# Patient Record
Sex: Male | Born: 1965
Health system: Southern US, Community
[De-identification: ages and names within clinical notes are randomized; demographics above are authoritative.]

## PROBLEM LIST (undated history)

## (undated) DIAGNOSIS — E119 Type 2 diabetes mellitus without complications: Secondary | ICD-10-CM

## (undated) DIAGNOSIS — I10 Essential (primary) hypertension: Secondary | ICD-10-CM

## (undated) DIAGNOSIS — J449 Chronic obstructive pulmonary disease, unspecified: Secondary | ICD-10-CM

---

## 2017-10-20 ENCOUNTER — Emergency Department (HOSPITAL_COMMUNITY)
Admission: EM | Admit: 2017-10-20 | Discharge: 2017-10-21 | Disposition: A | Discharge status: Home / Self Care | Payer: Self-pay | Attending: Emergency Medicine | Admitting: Emergency Medicine

## 2017-10-20 DIAGNOSIS — I10 Essential (primary) hypertension: Secondary | ICD-10-CM | POA: Insufficient documentation

## 2017-10-20 LAB — CBC WITH DIFFERENTIAL/PLATELET
Basophils Absolute: 0 10*3/uL (ref 0.0–0.1)
Basophils Relative: 0 %
EOS ABS: 0.2 10*3/uL (ref 0.0–0.7)
Eosinophils Relative: 5 %
HCT: 45.4 % (ref 39.0–52.0)
HEMOGLOBIN: 15.5 g/dL (ref 13.0–17.0)
LYMPHS ABS: 1.3 10*3/uL (ref 0.7–4.0)
LYMPHS PCT: 32 %
MCH: 28.6 pg (ref 26.0–34.0)
MCHC: 34.1 g/dL (ref 30.0–36.0)
MCV: 83.8 fL (ref 78.0–100.0)
Monocytes Absolute: 0.4 10*3/uL (ref 0.1–1.0)
Monocytes Relative: 9 %
NEUTROS PCT: 54 %
Neutro Abs: 2.2 10*3/uL (ref 1.7–7.7)
Platelets: 224 10*3/uL (ref 150–400)
RBC: 5.42 MIL/uL (ref 4.22–5.81)
RDW: 13.7 % (ref 11.5–15.5)
WBC: 4.1 10*3/uL (ref 4.0–10.5)

## 2017-10-20 NOTE — ED Triage Notes (Signed)
Patient here via EMS with complaints of hypertension. Reports that he is new to the area and does not have a primary care provider yet. Need refill on medication.

## 2017-10-20 NOTE — ED Notes (Addendum)
Pt stated that he had an incident of dizziness about 2 hours ago. He is asymptomatic at this time. He has no complaints. Denies N/V/CP or vision changes. Stated it has been months since he has had his HTN medication and his brother is on dialysis and when he became dizzy he "freaked out."

## 2017-10-20 NOTE — ED Provider Notes (Signed)
[facility]-EMERGENCY DEPT Provider Note   CSN: [id number] Arrival date & time: 10/20/17  2201     History   Chief Complaint Chief Complaint  Patient presents with  . Hypertension  . Medication Refill    HPI Donald Malone is a 52 y.o. male.  The history is provided by the patient and medical records. No language interpreter was used.     Donald Malone is a 52 y.o. male  with a PMH of HTN who presents to the Emergency Department complaining of elevated blood pressure and dizziness today. Patient reports that he just learned that his biological brother (patient is adopted and just introduced to biological family) is starting dialysis. He was reading about what could damage kidneys and read that uncontrolled hypertension could cause kidney damage.  He reports history of uncontrolled hypertension and has not been on his prescribed medication for the last 4-5 months.  He prior had been on enalapril 5 mg daily.  He states that when he learned this could cause such damage to the kidneys, leading to dialysis, that he "freaked out.  He started feeling weak and dizzy.  This lasted about 20 minutes and improved when he sat down.  He currently is asymptomatic.  He denies any current weakness, dizziness or lightheadedness.  He never had any chest pain or shortness of breath.  No abdominal pain or back pain.  He just moved to the area and has not found a primary care doctor.   No past medical history on file.  There are no active problems to display for this patient.       Home Medications    Prior to Admission medications   Medication Sig Start Date End Date Taking? Authorizing Provider  enalapril (VASOTEC) 5 MG tablet Take 1 tablet (5 mg total) by mouth daily. 10/21/17   Dominik Yordy, Chase Picket, PA-C    Family History No family history on file.  Social History Social History   Tobacco Use  . Smoking status: Not on file  Substance Use Topics  . Alcohol use: Not on  file  . Drug use: Not on file     Allergies   Patient has no allergy information on record.   Review of Systems Review of Systems  Respiratory: Negative for shortness of breath.   Cardiovascular: Negative for chest pain.  Neurological: Positive for dizziness and weakness.  All other systems reviewed and are negative.    Physical Exam Updated Vital Signs BP (!) 116/45 Comment: Pt sleeping  Pulse 63   Temp 97.6 F (36.4 C) (Oral)   Resp 16   SpO2 96%   Physical Exam  Constitutional: He is oriented to person, place, and time. He appears well-developed and well-nourished. No distress.  HENT:  Head: Normocephalic and atraumatic.  Cardiovascular: Normal rate, regular rhythm and normal heart sounds.  No murmur heard. Pulmonary/Chest: Effort normal and breath sounds normal. No respiratory distress.  Abdominal: Soft. He exhibits no distension. There is no tenderness.  Musculoskeletal: He exhibits no edema.  Neurological: He is alert and oriented to person, place, and time.  Alert, oriented, thought content appropriate, able to give a coherent history. Speech is clear and goal oriented, able to follow commands.  Cranial Nerves:  II:  Peripheral visual fields grossly normal, pupils equal, round, reactive to light III, IV, VI: EOM intact bilaterally, ptosis not present V,VII: smile symmetric, eyes kept closed tightly against resistance, facial light touch sensation equal VIII: hearing grossly normal IX, X:  symmetric soft palate movement, uvula elevates symmetrically  XI: bilateral shoulder shrug symmetric and strong XII: midline tongue extension 5/5 muscle strength in upper and lower extremities bilaterally including strong and equal grip strength and dorsiflexion/plantar flexion Sensory to light touch normal in all four extremities.  Normal finger-to-nose and rapid alternating movements; normal gait and balance. Negative romberg, no pronator drift.  Skin: Skin is warm and dry.    Nursing note and vitals reviewed.    ED Treatments / Results  Labs (all labs ordered are listed, but only abnormal results are displayed) Labs Reviewed  BASIC METABOLIC PANEL - Abnormal; Notable for the following components:      Result Value   Glucose, Bld 114 (*)    Creatinine, Ser 1.32 (*)    Calcium 8.7 (*)    All other components within normal limits  CBC WITH DIFFERENTIAL/PLATELET  I-STAT TROPONIN, ED    EKG EKG Interpretation  Date/Time:  Tuesday October 20 2017 23:30:22 EDT Ventricular Rate:  69 PR Interval:    QRS Duration: 86 QT Interval:  422 QTC Calculation: 453 R Axis:   52 Text Interpretation:  Sinus rhythm Borderline repolarization abnormality Confirmed by Geoffery Lyons ([id number]) on 10/20/2017 11:55:21 PM   Radiology No results found.  Procedures Procedures (including critical care time)  Medications Ordered in ED Medications  enalapril (VASOTEC) tablet 5 mg (5 mg Oral Given 10/21/17 0140)     Initial Impression / Assessment and Plan / ED Course  I have reviewed the triage vital signs and the nursing notes.  Pertinent labs & imaging results that were available during my care of the patient were reviewed by me and considered in my medical decision making (see chart for details).    Donald Malone is a 52 y.o. male who presents to ED for concerns of elevated blood pressure. He also states that he was reading that uncontrolled hypertension could cause kidney damage leading to dialysis and "freaked out". He had episode of lightheadedness and dizziness lasting about 20-30 minutes which resolved on its own. Currently asymptomatic. Never had chest pain, shortness of breath. No focal neuro deficits. Normal cardiopulmonary examination. Patient is currently asymptomatic with no sxs to suggest end organ damage. No chest pain, diaphoresis, nausea or other ACS symptoms. No headache or neurologic complaints. No change in urine output. Intact and equal distal pulses. Labs  reviewed and reassuring. Given home BP medication which he has not been taking for several months with improvement in BP.  Patient recently moved to the area and has not found primary care physician in town yet gave short course of his blood pressure medication and resources for PCPs. Evaluation does not show pathology that would require ongoing emergent intervention or inpatient treatment.Stressed the importance of taking blood pressure medication as directed.  Patient understands importance of close PCP follow up for further BP management. Reasons to return to ER were discussed and all questions answered.    Final Clinical Impressions(s) / ED Diagnoses   Final diagnoses:  Hypertension, unspecified type    ED Discharge Orders        Ordered    enalapril (VASOTEC) 5 MG tablet  Daily     10/21/17 0120       Wiley Magan, Chase Picket, PA-C 10/21/17 4034    Geoffery Lyons, MD 10/21/17 [phone]

## 2017-10-21 LAB — BASIC METABOLIC PANEL
ANION GAP: 9 (ref 5–15)
BUN: 15 mg/dL (ref 6–20)
CALCIUM: 8.7 mg/dL — AB (ref 8.9–10.3)
CO2: 27 mmol/L (ref 22–32)
Chloride: 105 mmol/L (ref 101–111)
Creatinine, Ser: 1.32 mg/dL — ABNORMAL HIGH (ref 0.61–1.24)
GFR calc Af Amer: >60 mL/min (ref >60)
GFR calc non Af Amer: >60 mL/min (ref >60)
GLUCOSE: 114 mg/dL — AB (ref 65–99)
Potassium: 4.6 mmol/L (ref 3.5–5.1)
Sodium: 141 mmol/L (ref 135–145)

## 2017-10-21 LAB — I-STAT TROPONIN, ED: Troponin i, poc: 0.02 ng/mL (ref 0.00–0.08)

## 2017-10-21 MED ORDER — ENALAPRIL MALEATE 5 MG PO TABS: 5.0000 mg | ORAL_TABLET | Freq: Every day | ORAL | 0 refills | Status: DC

## 2017-10-21 MED ORDER — ENALAPRIL MALEATE 5 MG PO TABS
5.0000 mg | ORAL_TABLET | Freq: Once | ORAL | Status: AC
  Administered 2017-10-21: 5 mg via ORAL
  Filled 2017-10-21: qty 1

## 2017-10-21 NOTE — Discharge Instructions (Signed)
It was my pleasure taking care of you today!  I have refilled your blood pressure medication, but it is very important that you follow up with a primary care doctor for further refills. See information below.   Return to ER for new or worsening symptoms, any additional concerns.   To find a primary care or specialty doctor please call [phone] or [phone] to access "[facility] Find a Doctor Service."  You may also go on the [facility] website at [email or web address]  There are also multiple [facility], [facility] and [facility] practices throughout the [place] that are frequently accepting new patients. You may find a clinic that is close to your home and contact them.  [facility] - [address] [id number] [phone]  [facility] in [place] (also locations in [place] and [place]) - [address] [place] [phone]  [facility] - [address] [place] [phone]

## 2019-09-26 ENCOUNTER — Emergency Department (HOSPITAL_COMMUNITY)
Admission: EM | Admit: 2019-09-26 | Discharge: 2019-09-26 | Disposition: A | Discharge status: Home / Self Care | Payer: Self-pay | Attending: Emergency Medicine | Admitting: Emergency Medicine

## 2019-09-26 ENCOUNTER — Encounter (HOSPITAL_COMMUNITY): Payer: Self-pay

## 2019-09-26 ENCOUNTER — Other Ambulatory Visit: Payer: Self-pay

## 2019-09-26 DIAGNOSIS — R600 Localized edema: Secondary | ICD-10-CM

## 2019-09-26 DIAGNOSIS — J449 Chronic obstructive pulmonary disease, unspecified: Secondary | ICD-10-CM | POA: Insufficient documentation

## 2019-09-26 DIAGNOSIS — F17210 Nicotine dependence, cigarettes, uncomplicated: Secondary | ICD-10-CM | POA: Insufficient documentation

## 2019-09-26 DIAGNOSIS — I10 Essential (primary) hypertension: Secondary | ICD-10-CM | POA: Insufficient documentation

## 2019-09-26 DIAGNOSIS — Z7984 Long term (current) use of oral hypoglycemic drugs: Secondary | ICD-10-CM | POA: Insufficient documentation

## 2019-09-26 DIAGNOSIS — E119 Type 2 diabetes mellitus without complications: Secondary | ICD-10-CM | POA: Insufficient documentation

## 2019-09-26 HISTORY — DX: Chronic obstructive pulmonary disease, unspecified: J44.9

## 2019-09-26 HISTORY — DX: Type 2 diabetes mellitus without complications: E11.9

## 2019-09-26 HISTORY — DX: Essential (primary) hypertension: I10

## 2019-09-26 LAB — BASIC METABOLIC PANEL
Anion gap: 10 (ref 5–15)
BUN: 12 mg/dL (ref 6–20)
CO2: 23 mmol/L (ref 22–32)
Calcium: 9 mg/dL (ref 8.9–10.3)
Chloride: 103 mmol/L (ref 98–111)
Creatinine, Ser: 1.09 mg/dL (ref 0.61–1.24)
GFR calc Af Amer: >60 mL/min (ref >60)
GFR calc non Af Amer: >60 mL/min (ref >60)
Glucose, Bld: 124 mg/dL — ABNORMAL HIGH (ref 70–99)
Potassium: 3.8 mmol/L (ref 3.5–5.1)
Sodium: 136 mmol/L (ref 135–145)

## 2019-09-26 LAB — CBC
HCT: 48.4 % (ref 39.0–52.0)
Hemoglobin: 15.6 g/dL (ref 13.0–17.0)
MCH: 28.5 pg (ref 26.0–34.0)
MCHC: 32.2 g/dL (ref 30.0–36.0)
MCV: 88.3 fL (ref 80.0–100.0)
Platelets: 188 10*3/uL (ref 150–400)
RBC: 5.48 MIL/uL (ref 4.22–5.81)
RDW: 12.9 % (ref 11.5–15.5)
WBC: 5.1 10*3/uL (ref 4.0–10.5)
nRBC: 0 % (ref 0.0–0.2)

## 2019-09-26 LAB — BRAIN NATRIURETIC PEPTIDE: B Natriuretic Peptide: 46.3 pg/mL (ref 0.0–100.0)

## 2019-09-26 LAB — HEMOGLOBIN A1C
Hgb A1c MFr Bld: 6.8 % — ABNORMAL HIGH (ref 4.8–5.6)
Mean Plasma Glucose: 148.46 mg/dL

## 2019-09-26 MED ORDER — ENALAPRIL MALEATE 5 MG PO TABS
5.0000 mg | ORAL_TABLET | Freq: Once | ORAL | Status: AC
  Administered 2019-09-26: 5 mg via ORAL
  Filled 2019-09-26: qty 1

## 2019-09-26 MED ORDER — ENALAPRIL MALEATE 5 MG PO TABS: 5.0000 mg | ORAL_TABLET | Freq: Every day | ORAL | 0 refills | Status: DC

## 2019-09-26 MED ORDER — METFORMIN HCL 500 MG PO TABS: 500.0000 mg | ORAL_TABLET | Freq: Two times a day (BID) | ORAL | 0 refills | Status: DC

## 2019-09-26 NOTE — Discharge Instructions (Signed)
I recommended that you buy a pair of compression stockings to wear in the daytime in particular when you are at work.  When you are at home he should try to elevate your legs on the couch.  This should help with some of the fluid buildup is in your legs.  It is extremely important that you stop smoking.  Smoking can be a silent killer.  He can lead to lung cancer, high blood pressure, heart disease, stroke, and many other medical conditions.  Is also important that you stay compliant with your medications.  I will prescribe you 3 months.  You should be able to follow-up with your primary care doctor in that time.   It is extremely important that you establish care with a primary care provider.  You need someone to help you manage your high blood pressure and your diabetes and other medical problems long-term.

## 2019-09-26 NOTE — ED Provider Notes (Signed)
[facility] EMERGENCY DEPARTMENT Provider Note   CSN: [id number] Arrival date & time: 09/26/19  4503     History Chief Complaint  Patient presents with  . Leg Swelling    Donald Malone is a 54 y.o. male w/ hx of DM2 (on metformin formerly), HTN on enalapril 5 mg, smoking, obesity, presenting to the ED with leg swelling.  Patient reports his bilateral legs have been swelling on and off for months, but persistently getting worse in the past 1-2 months. It is symmetrical.  Donald Malone felt like today his legs were "heavy" and Donald Malone had a hard time walking.  Donald Malone denies orthopnea, dyspnea on exertion.  Donald Malone denies CP, SOB, or coughing.  Donald Malone denies fevers, or chills.  Donald Malone denies hx of DVT or PE.    Donald Malone reports being told Donald Malone's had "COPD" in the past but does not wear oxygen at home.  Donald Malone continues smoking 1/2 ppd cigarettes.  Donald Malone has not tried to quit yet.  Donald Malone has not had a COPD "flare" in "many years."  Donald Malone reports family hx of a biological brother on dialysis.  Donald Malone moved to [place] 2 years ago to help take care of his foster mother.  Donald Malone has not established care with a doctor down here.  Donald Malone tells me all of his medication prescriptions ran out about 3 months ago (metformin, enalapril).  HPI     Past Medical History:  Diagnosis Date  . COPD (chronic obstructive pulmonary disease) (HCC)   . Diabetes mellitus without complication (HCC)   . Hypertension     There are no problems to display for this patient.   History reviewed. No pertinent surgical history.     No family history on file.  Social History   Tobacco Use  . Smoking status: Current Every Day Smoker    Packs/day: 1.00  . Smokeless tobacco: Never Used  Substance Use Topics  . Alcohol use: Not on file  . Drug use: Not on file    Home Medications Prior to Admission medications   Medication Sig Start Date End Date Taking? Authorizing Provider  enalapril (VASOTEC) 5 MG tablet Take 1 tablet (5 mg total) by mouth  daily. Patient not taking: Reported on 09/26/2019 10/21/17   Ward, Chase Picket, PA-C  enalapril (VASOTEC) 5 MG tablet Take 1 tablet (5 mg total) by mouth daily. 09/26/19 12/25/19  Terald Sleeper, MD  metFORMIN (GLUCOPHAGE) 500 MG tablet Take 1 tablet (500 mg total) by mouth 2 (two) times daily with a meal. 09/26/19 12/25/19  Arissa Fagin, Kermit Balo, MD    Allergies    Patient has no known allergies.  Review of Systems   Review of Systems  Constitutional: Negative for chills and fever.  Respiratory: Negative for cough and shortness of breath.   Cardiovascular: Positive for leg swelling. Negative for chest pain and palpitations.  Gastrointestinal: Negative for abdominal pain and vomiting.  Musculoskeletal: Positive for gait problem. Negative for myalgias.  Skin: Negative for rash and wound.  Neurological: Negative for syncope and speech difficulty.  Psychiatric/Behavioral: Negative for agitation and confusion.  All other systems reviewed and are negative.   Physical Exam Updated Vital Signs BP (!) 172/112   Pulse 85   Temp 98.1 F (36.7 C) (Oral)   Resp (!) 22   Ht 5\' 2"  (1.575 m)   Wt 102.1 kg   SpO2 93%   BMI 41.15 kg/m   Physical Exam Vitals and nursing note reviewed.  Constitutional:  Appearance: Donald Malone is well-developed. Donald Malone is obese.  HENT:     Head: Normocephalic and atraumatic.  Eyes:     Conjunctiva/sclera: Conjunctivae normal.  Cardiovascular:     Rate and Rhythm: Normal rate and regular rhythm.     Pulses: Normal pulses.  Pulmonary:     Effort: Pulmonary effort is normal. No respiratory distress.     Breath sounds: Normal breath sounds. No wheezing.  Abdominal:     General: There is no distension.     Palpations: Abdomen is soft.     Tenderness: There is no abdominal tenderness.  Musculoskeletal:     Cervical back: Neck supple.     Right lower leg: Edema present.     Left lower leg: Edema present.     Comments: Symmetrical pitting edema of the lower extremities to  the mid-tibia Mild edema of the bilateral hands  Skin:    General: Skin is warm and dry.  Neurological:     Mental Status: Donald Malone is alert.  Psychiatric:        Mood and Affect: Mood normal.        Behavior: Behavior normal.     ED Results / Procedures / Treatments   Labs (all labs ordered are listed, but only abnormal results are displayed) Labs Reviewed  HEMOGLOBIN A1C - Abnormal; Notable for the following components:      Result Value   Hgb A1c MFr Bld 6.8 (*)    All other components within normal limits  BASIC METABOLIC PANEL - Abnormal; Notable for the following components:   Glucose, Bld 124 (*)    All other components within normal limits  CBC  BRAIN NATRIURETIC PEPTIDE    EKG EKG Interpretation  Date/Time:  Monday September 26 2019 08:37:29 EST Ventricular Rate:  94 PR Interval:    QRS Duration: 87 QT Interval:  365 QTC Calculation: 457 R Axis:   13 Text Interpretation: Sinus rhythm Probable left atrial enlargement Anteroseptal infarct, old No STEMI Confirmed by Alvester Chou [phone]) on 09/26/2019 8:52:03 AM   Radiology No results found.  Procedures Procedures (including critical care time)  Medications Ordered in ED Medications  enalapril (VASOTEC) tablet 5 mg (5 mg Oral Given 09/26/19 0910)    ED Course  I have reviewed the triage vital signs and the nursing notes.  Pertinent labs & imaging results that were available during my care of the patient were reviewed by me and considered in my medical decision making (see chart for details).  54 yo male presenting to ED with leg swelling ongoing for several months.  Donald Malone hasn't been on his meds in at least 3 months, and does not yet have a PCP down here.  Donald Malone likely has untreated diabetes.  Per his BP today, his pressure has not been well managed.    I have a low suspicion for infection of the lower extremities, or for DVT, based on his history and clinical exam.  This looks like pitting edema from volume retention,  which may be related to his heart or his kidneys, or both.  There's no evidence of acute respiratory distress, or pulmonary volume overload suggestive of clinically significant CHF exacerbation.  We'll check a BNP here, but I think this can be managed as an outpatient.  I'll also check an HA1C, BMP, and CBC to evaluate his kidney function, electrolytes, BS, and basic labs.  This will help with his eventual outpatient management.  I can refill his scripts today.  Clinical Course as  of Sep 26 1831  Mon Sep 26, 2019  1002 Patient updated on results.  Advised to wear compression stockings, f/u with new PCP.  I would prefer not to start him on lasix as it may be 2-3 months before Donald Malone can see a doctor, and I don't think Donald Malone needs a diuretic at this time.  Donald Malone agrees with this plan .   [MT]    Clinical Course User Index [MT] Wyvonnia Dusky, MD    Final Clinical Impression(s) / ED Diagnoses Final diagnoses:  Hypertension, unspecified type  Bilateral leg edema    Rx / DC Orders ED Discharge Orders         Ordered    enalapril (VASOTEC) 5 MG tablet  Daily     09/26/19 1006    metFORMIN (GLUCOPHAGE) 500 MG tablet  2 times daily with meals     09/26/19 1006           Wyvonnia Dusky, MD 09/26/19 [phone]

## 2019-09-26 NOTE — ED Triage Notes (Signed)
Pt reports bilateral leg swelling over the past few weeks. Denies SOB or CP. States he has not been on his diabetes or HTN medications for 3 months. Pt arrives a/o, ambulatory.

## 2020-02-01 ENCOUNTER — Other Ambulatory Visit: Payer: Self-pay

## 2020-02-01 ENCOUNTER — Emergency Department (HOSPITAL_COMMUNITY): Payer: Self-pay

## 2020-02-01 ENCOUNTER — Encounter (HOSPITAL_COMMUNITY): Payer: Self-pay | Admitting: Emergency Medicine

## 2020-02-01 ENCOUNTER — Observation Stay (HOSPITAL_COMMUNITY): Payer: Self-pay

## 2020-02-01 ENCOUNTER — Inpatient Hospital Stay (HOSPITAL_COMMUNITY)
Admission: EM | Admit: 2020-02-01 | Discharge: 2020-02-08 | DRG: 065 | Disposition: A | Discharge status: Home / Self Care | Payer: Self-pay | Attending: Internal Medicine | Admitting: Internal Medicine

## 2020-02-01 DIAGNOSIS — K047 Periapical abscess without sinus: Secondary | ICD-10-CM | POA: Diagnosis present

## 2020-02-01 DIAGNOSIS — Z20822 Contact with and (suspected) exposure to covid-19: Secondary | ICD-10-CM | POA: Diagnosis present

## 2020-02-01 DIAGNOSIS — E119 Type 2 diabetes mellitus without complications: Secondary | ICD-10-CM

## 2020-02-01 DIAGNOSIS — R29898 Other symptoms and signs involving the musculoskeletal system: Secondary | ICD-10-CM

## 2020-02-01 DIAGNOSIS — E1151 Type 2 diabetes mellitus with diabetic peripheral angiopathy without gangrene: Secondary | ICD-10-CM | POA: Diagnosis present

## 2020-02-01 DIAGNOSIS — I639 Cerebral infarction, unspecified: Secondary | ICD-10-CM | POA: Diagnosis present

## 2020-02-01 DIAGNOSIS — J449 Chronic obstructive pulmonary disease, unspecified: Secondary | ICD-10-CM | POA: Diagnosis present

## 2020-02-01 DIAGNOSIS — E785 Hyperlipidemia, unspecified: Secondary | ICD-10-CM | POA: Diagnosis present

## 2020-02-01 DIAGNOSIS — F1410 Cocaine abuse, uncomplicated: Secondary | ICD-10-CM | POA: Diagnosis present

## 2020-02-01 DIAGNOSIS — Z7984 Long term (current) use of oral hypoglycemic drugs: Secondary | ICD-10-CM

## 2020-02-01 DIAGNOSIS — Z6839 Body mass index (BMI) 39.0-39.9, adult: Secondary | ICD-10-CM

## 2020-02-01 DIAGNOSIS — I69354 Hemiplegia and hemiparesis following cerebral infarction affecting left non-dominant side: Secondary | ICD-10-CM

## 2020-02-01 DIAGNOSIS — R29818 Other symptoms and signs involving the nervous system: Secondary | ICD-10-CM

## 2020-02-01 DIAGNOSIS — I10 Essential (primary) hypertension: Secondary | ICD-10-CM | POA: Diagnosis present

## 2020-02-01 DIAGNOSIS — K029 Dental caries, unspecified: Secondary | ICD-10-CM | POA: Diagnosis present

## 2020-02-01 DIAGNOSIS — Z590 Homelessness: Secondary | ICD-10-CM

## 2020-02-01 DIAGNOSIS — I6349 Cerebral infarction due to embolism of other cerebral artery: Principal | ICD-10-CM | POA: Diagnosis present

## 2020-02-01 DIAGNOSIS — R29701 NIHSS score 1: Secondary | ICD-10-CM | POA: Diagnosis present

## 2020-02-01 DIAGNOSIS — F17210 Nicotine dependence, cigarettes, uncomplicated: Secondary | ICD-10-CM | POA: Diagnosis present

## 2020-02-01 LAB — BASIC METABOLIC PANEL
Anion gap: 11 (ref 5–15)
BUN: 14 mg/dL (ref 6–20)
CO2: 23 mmol/L (ref 22–32)
Calcium: 8.9 mg/dL (ref 8.9–10.3)
Chloride: 107 mmol/L (ref 98–111)
Creatinine, Ser: 1.19 mg/dL (ref 0.61–1.24)
GFR calc Af Amer: >60 mL/min (ref >60)
GFR calc non Af Amer: >60 mL/min (ref >60)
Glucose, Bld: 209 mg/dL — ABNORMAL HIGH (ref 70–99)
Potassium: 4 mmol/L (ref 3.5–5.1)
Sodium: 141 mmol/L (ref 135–145)

## 2020-02-01 LAB — URINALYSIS, ROUTINE W REFLEX MICROSCOPIC
Bilirubin Urine: NEGATIVE
Glucose, UA: NEGATIVE mg/dL
Hgb urine dipstick: NEGATIVE
Ketones, ur: NEGATIVE mg/dL
Leukocytes,Ua: NEGATIVE
Nitrite: NEGATIVE
Protein, ur: NEGATIVE mg/dL
Specific Gravity, Urine: 1.023 (ref 1.005–1.030)
pH: 5 (ref 5.0–8.0)

## 2020-02-01 LAB — CBC
HCT: 42.7 % (ref 39.0–52.0)
Hemoglobin: 14.1 g/dL (ref 13.0–17.0)
MCH: 28.9 pg (ref 26.0–34.0)
MCHC: 33 g/dL (ref 30.0–36.0)
MCV: 87.5 fL (ref 80.0–100.0)
Platelets: 156 10*3/uL (ref 150–400)
RBC: 4.88 MIL/uL (ref 4.22–5.81)
RDW: 13.3 % (ref 11.5–15.5)
WBC: 4.7 10*3/uL (ref 4.0–10.5)
nRBC: 0 % (ref 0.0–0.2)

## 2020-02-01 LAB — SARS CORONAVIRUS 2 BY RT PCR (HOSPITAL ORDER, PERFORMED IN ~~LOC~~ HOSPITAL LAB): SARS Coronavirus 2: NEGATIVE

## 2020-02-01 LAB — CBG MONITORING, ED
Glucose-Capillary: 144 mg/dL — ABNORMAL HIGH (ref 70–99)
Glucose-Capillary: 133 mg/dL — ABNORMAL HIGH (ref 70–99)

## 2020-02-01 MED ORDER — STROKE: EARLY STAGES OF RECOVERY BOOK: Freq: Once | Status: DC

## 2020-02-01 MED ORDER — ACETAMINOPHEN 160 MG/5ML PO SOLN: 650.0000 mg | ORAL | Status: DC | PRN

## 2020-02-01 MED ORDER — ENOXAPARIN SODIUM 60 MG/0.6ML ~~LOC~~ SOLN
50.0000 mg | SUBCUTANEOUS | Status: DC
  Administered 2020-02-02 – 2020-02-07 (×7): 50 mg via SUBCUTANEOUS
  Filled 2020-02-01: qty 0.5
  Filled 2020-02-01 (×4): qty 0.6
  Filled 2020-02-01: qty 0.5
  Filled 2020-02-01 (×2): qty 0.6

## 2020-02-01 MED ORDER — INSULIN ASPART 100 UNIT/ML ~~LOC~~ SOLN
0.0000 [IU] | Freq: Three times a day (TID) | SUBCUTANEOUS | Status: DC
  Administered 2020-02-08: 1 [IU] via SUBCUTANEOUS

## 2020-02-01 MED ORDER — ACETAMINOPHEN 650 MG RE SUPP: 650.0000 mg | RECTAL | Status: DC | PRN

## 2020-02-01 MED ORDER — SENNOSIDES-DOCUSATE SODIUM 8.6-50 MG PO TABS: 1.0000 | ORAL_TABLET | Freq: Every evening | ORAL | Status: DC | PRN

## 2020-02-01 MED ORDER — ASPIRIN 300 MG RE SUPP: 300.0000 mg | Freq: Every day | RECTAL | Status: DC

## 2020-02-01 MED ORDER — INSULIN ASPART 100 UNIT/ML ~~LOC~~ SOLN: 0.0000 [IU] | Freq: Every day | SUBCUTANEOUS | Status: DC

## 2020-02-01 MED ORDER — SODIUM CHLORIDE 0.9 % IV SOLN: INTRAVENOUS | Status: AC

## 2020-02-01 MED ORDER — ACETAMINOPHEN 325 MG PO TABS: 650.0000 mg | ORAL_TABLET | ORAL | Status: DC | PRN

## 2020-02-01 MED ORDER — ASPIRIN 325 MG PO TABS: 325.0000 mg | ORAL_TABLET | Freq: Every day | ORAL | Status: DC

## 2020-02-01 NOTE — Social Work (Signed)
CSW met with Pt at bedside. Pt is currently homeless and without PCP. CSW provided Pt with information about [place] bus and counseled Pt about [employer].  CSW provided Pt with bus pass for transportation upon d/c.  CSW will continue to follow for d/c needs.

## 2020-02-01 NOTE — H&P (Signed)
History and Physical    Donald Malone CHE:[id number] DOB: 10/14/1965 DOA: 02/01/2020  PCP: Patient, No Pcp Per   Patient coming from: Home   Chief Complaint: Left sided weakness   HPI: Donald Malone is a 54 y.o. male with medical history significant for COPD, type 2 diabetes mellitus, hypertension, and homelessness, presented to the emergency department with left-sided weakness.  Patient reports that he was in his usual state of health on 01/30/2020 when he noticed some weakness involving the left side.  He has had persistent left arm and left leg weakness since then, had some transient blurred vision, but denies any headache, numbness, difficulty with speech or swallowing, or fevers.  He has been able to ambulate, though with some difficulty.  He denies any chest pain, palpitations, cough, or shortness of breath.  Patient was in the [employer] and currently works for a [employer] but has been homeless for the past 2 years, living on the streets, does not have a PCP, and has not been taking any medications recently.  ED Course: Upon arrival to the ED, patient is found to be afebrile, saturating well on room air, and hypertensive to 160/100.  EKG features sinus rhythm with normal rate and intervals.  Head CT is negative for acute intracranial abnormality.  MRI brain is concerning for 11 mm acute infarction within the right paramedian pons.  Chemistry panel notable for glucose of 209 and CBC is unremarkable.  Neurology was consulted by the ED physician and recommends medical admission.  Review of Systems:  All other systems reviewed and apart from HPI, are negative.  Past Medical History:  Diagnosis Date  . COPD (chronic obstructive pulmonary disease) (HCC)   . Diabetes mellitus without complication (HCC)   . Hypertension     History reviewed. No pertinent surgical history.   reports that he has been smoking. He has been smoking about 1.00 pack per day. He has  never used smokeless tobacco. No history on file for alcohol use and drug use.  No Known Allergies  History reviewed. No pertinent family history.   Prior to Admission medications   Medication Sig Start Date End Date Taking? Authorizing Provider  enalapril (VASOTEC) 5 MG tablet Take 1 tablet (5 mg total) by mouth daily. Patient not taking: Reported on 09/26/2019 10/21/17   Ward, Chase Picket, PA-C  enalapril (VASOTEC) 5 MG tablet Take 1 tablet (5 mg total) by mouth daily. 09/26/19 12/25/19  Terald Sleeper, MD  metFORMIN (GLUCOPHAGE) 500 MG tablet Take 1 tablet (500 mg total) by mouth 2 (two) times daily with a meal. 09/26/19 12/25/19  Terald Sleeper, MD    Physical Exam: Vitals:   02/01/20 1038 02/01/20 1301 02/01/20 1506  BP: (!) 146/124 (!) 160/79 (!) 164/102  Pulse: 79 (!) 45 61  Resp: 16 18 16   Temp: 98.2 F (36.8 C)    SpO2: 99% 100% 97%     Constitutional: NAD, calm  Eyes: PERTLA, lids and conjunctivae normal ENMT: Mucous membranes are moist. Posterior pharynx clear of any exudate or lesions.   Neck: normal, supple, no masses, no thyromegaly Respiratory: clear to auscultation bilaterally, no wheezing, no crackles. No accessory muscle use.  Cardiovascular: S1 & S2 heard, regular rate and rhythm. No extremity edema.  Abdomen: No distension, no tenderness, soft. Bowel sounds active.  Musculoskeletal: no clubbing / cyanosis. No joint deformity upper and lower extremities.   Skin: no significant rashes, lesions, ulcers. Warm, dry, well-perfused. Neurologic:  PERRL, EOMI, no dysarthria or aphasia. Left lower facial weakness. Sensation to light touch intact throughout. Strength 4/5 throughout LUE and LLE, 5/5 on right.   Psychiatric: Alert and oriented to person, place, and situation. Very pleasant and cooperative.    Labs and Imaging on Admission: I have personally reviewed following labs and imaging studies  CBC: Recent Labs  Lab 02/01/20 1043  WBC 4.7  HGB 14.1  HCT 42.7    MCV 87.5  PLT 156   Basic Metabolic Panel: Recent Labs  Lab 02/01/20 1043  NA 141  K 4.0  CL 107  CO2 23  GLUCOSE 209*  BUN 14  CREATININE 1.19  CALCIUM 8.9   GFR: CrCl cannot be calculated (Unknown ideal weight.). Liver Function Tests: No results for input(s): AST, ALT, ALKPHOS, BILITOT, PROT, ALBUMIN in the last 168 hours. No results for input(s): LIPASE, AMYLASE in the last 168 hours. No results for input(s): AMMONIA in the last 168 hours. Coagulation Profile: No results for input(s): INR, PROTIME in the last 168 hours. Cardiac Enzymes: No results for input(s): CKTOTAL, CKMB, CKMBINDEX, TROPONINI in the last 168 hours. BNP (last 3 results) No results for input(s): PROBNP in the last 8760 hours. HbA1C: No results for input(s): HGBA1C in the last 72 hours. CBG: Recent Labs  Lab 02/01/20 1741  GLUCAP 144*   Lipid Profile: No results for input(s): CHOL, HDL, LDLCALC, TRIG, CHOLHDL, LDLDIRECT in the last 72 hours. Thyroid Function Tests: No results for input(s): TSH, T4TOTAL, FREET4, T3FREE, THYROIDAB in the last 72 hours. Anemia Panel: No results for input(s): VITAMINB12, FOLATE, FERRITIN, TIBC, IRON, RETICCTPCT in the last 72 hours. Urine analysis:    Component Value Date/Time   COLORURINE YELLOW 02/01/2020 2055   APPEARANCEUR CLEAR 02/01/2020 2055   LABSPEC 1.023 02/01/2020 2055   PHURINE 5.0 02/01/2020 2055   GLUCOSEU NEGATIVE 02/01/2020 2055   HGBUR NEGATIVE 02/01/2020 2055   BILIRUBINUR NEGATIVE 02/01/2020 2055   KETONESUR NEGATIVE 02/01/2020 2055   PROTEINUR NEGATIVE 02/01/2020 2055   NITRITE NEGATIVE 02/01/2020 2055   LEUKOCYTESUR NEGATIVE 02/01/2020 2055   Sepsis Labs: @LABRCNTIP (procalcitonin:4,lacticidven:4) )No results found for this or any previous visit (from the past 240 hour(s)).   Radiological Exams on Admission: CT Head Wo Contrast  Result Date: 02/01/2020 CLINICAL DATA:  Generalized weakness, fatigue, dizziness, left arm weakness  EXAM: CT HEAD WITHOUT CONTRAST TECHNIQUE: Contiguous axial images were obtained from the base of the skull through the vertex without intravenous contrast. COMPARISON:  None. FINDINGS: Brain: Normal anatomic configuration. No abnormal intra or extra-axial mass lesion or fluid collection. No abnormal mass effect or midline shift. No evidence of acute intracranial hemorrhage or infarct. Ventricular size is normal. Cerebellum unremarkable. Vascular: Unremarkable Skull: Intact Sinuses/Orbits: Paranasal sinuses are clear. Orbits are unremarkable. Other: Mastoid air cells and middle ear cavities are clear. Multiple periapical abscesses are noted involving the visualized maxillary dentition IMPRESSION: No acute intracranial abnormality. Multiple periapical abscesses involving the visualized maxillary dentition Electronically Signed   By: 02/03/2020 MD   On: 02/01/2020 17:21   MR BRAIN WO CONTRAST  Result Date: 02/01/2020 CLINICAL DATA:  Provided history: 2 days of left-sided weakness EXAM: MRI HEAD WITHOUT CONTRAST TECHNIQUE: Multiplanar, multiecho pulse sequences of the brain and surrounding structures were obtained without intravenous contrast. COMPARISON:  Head CT performed earlier the same day 02/01/2020 FINDINGS: Brain: The examination is intermittently motion degraded. Most notably, there is severe motion degradation of the coronal T2 weighted sequence. Cerebral volume is normal for age. There  is an 11 mm acute infarct within the paramedian right pons (series 5, images 65-67). Corresponding T2/FLAIR hyperintensity at this site. Mild patchy T2/FLAIR hyperintensity within the cerebral white matter is nonspecific, but consistent with chronic small vessel ischemic disease. No evidence of intracranial mass. No chronic intracranial blood products are identified. No extra-axial fluid collection. No midline shift. Vascular: Expected proximal arterial flow voids. Skull and upper cervical spine: No focal marrow lesion.  Sinuses/Orbits: Visualized orbits show no acute finding. Small right maxillary sinus mucous retention cyst. No significant mastoid effusion. IMPRESSION: Motion degraded examination as described. 11 mm acute infarct within the paramedian right pons. Mild chronic small vessel ischemic changes within the cerebral white matter. Small right maxillary sinus mucous retention cyst Electronically Signed   By: Jackey Loge DO   On: 02/01/2020 20:36    EKG: Independently reviewed. Sinus rhythm, rate 81, QTc 471 ms.   Assessment/Plan   1. Acute ischemic CVA  - Presents with 2 days of left-sided weakness and is found on MRI to have acute ischemic infarction involving right paramedian pons  - Neurology is consulting and much appreciated  - Continue cardiac monitoring, frequent neuro checks  - Consult PT/OT/SLP  - Check MRA, echocardiogram, carotid US, A1c, and fasting lipids   2. Type II DM  - A1c was 6.8% in March 2021, serum glucose 209 in ED  - Previously on metformin, not taking any medications recently  - Update A1c, use SSI with [facility] for now, could likely resume metformin on discharge    3. Hypertension  - Previously on ACE-i, but not taking any medications recently  - Resume ACE-i once out of acute-phase ischemic CVA    4. Homelessness  - Patient has been homeless and without PCP or medications  - CM/SW is following for d/c needs    DVT prophylaxis: Lovenox  Code Status: Full  Family Communication: Discussed with patient  Disposition Plan:  Patient is from: Home  Anticipated d/c is to: Home  Anticipated d/c date is: 02/02/20 or 02/03/20 Patient currently: Pending additional imaging, labs, and consultations   Consults called: Neurology  Admission status: Observation    Briscoe Deutscher, MD [facility]  02/01/2020, 10:24 PM

## 2020-02-01 NOTE — ED Provider Notes (Signed)
[facility] EMERGENCY DEPARTMENT Provider Note   CSN: [id number] Arrival date & time: 02/01/20  1029     History Chief Complaint  Patient presents with  . Fatigue    Donald Malone is a 54 y.o. male.  HPI  Patient is a 54 year old male with a medical history as noted below.  Patient states he has been homeless for the last 1 month.  He was at work 2 days ago he states he began experiencing left-sided weakness of both the left upper extremity and left lower extremity.  He states his weakness was so significant that he was having difficulty performing his work.  At this time, he states he was also feeling "very hot" and was extremely fatigued.  Has associated symptoms ultimately alleviated but patient states he still feels weak on the left side.  Patient states he is out of his hypertensive medications and DM medications.  He has no other complaints at this time.  No fevers, chills, chest pain, shortness of breath, abdominal pain, nausea, vomiting, diarrhea, syncope, dizziness, lightheadedness.     Past Medical History:  Diagnosis Date  . COPD (chronic obstructive pulmonary disease) (HCC)   . Diabetes mellitus without complication (HCC)   . Hypertension     There are no problems to display for this patient.   History reviewed. No pertinent surgical history.     No family history on file.  Social History   Tobacco Use  . Smoking status: Current Every Day Smoker    Packs/day: 1.00  . Smokeless tobacco: Never Used  Substance Use Topics  . Alcohol use: Not on file  . Drug use: Not on file    Home Medications Prior to Admission medications   Medication Sig Start Date End Date Taking? Authorizing Provider  enalapril (VASOTEC) 5 MG tablet Take 1 tablet (5 mg total) by mouth daily. Patient not taking: Reported on 09/26/2019 10/21/17   Ward, Chase Picket, PA-C  enalapril (VASOTEC) 5 MG tablet Take 1 tablet (5 mg total) by mouth daily. 09/26/19 12/25/19   Terald Sleeper, MD  metFORMIN (GLUCOPHAGE) 500 MG tablet Take 1 tablet (500 mg total) by mouth 2 (two) times daily with a meal. 09/26/19 12/25/19  Trifan, Kermit Balo, MD    Allergies    Patient has no known allergies.  Review of Systems   Review of Systems  All other systems reviewed and are negative. Ten systems reviewed and are negative for acute change, except as noted in the HPI.   Physical Exam Updated Vital Signs BP (!) 164/102 (BP Location: Left Arm)   Pulse 61   Temp 98.2 F (36.8 C)   Resp 16   SpO2 97%   Physical Exam Vitals and nursing note reviewed.  Constitutional:      General: He is not in acute distress.    Appearance: Normal appearance. He is not ill-appearing, toxic-appearing or diaphoretic.  HENT:     Head: Normocephalic and atraumatic.     Right Ear: External ear normal.     Left Ear: External ear normal.     Nose: Nose normal.     Mouth/Throat:     Mouth: Mucous membranes are moist.     Pharynx: Oropharynx is clear. No oropharyngeal exudate or posterior oropharyngeal erythema.  Eyes:     General: No scleral icterus.       Right eye: No discharge.        Left eye: No discharge.     Extraocular  Movements: Extraocular movements intact.     Conjunctiva/sclera: Conjunctivae normal.     Pupils: Pupils are equal, round, and reactive to light.     Comments: Extraocular movements intact.  Cardiovascular:     Rate and Rhythm: Normal rate and regular rhythm.     Pulses: Normal pulses.     Heart sounds: Normal heart sounds. No murmur heard.  No friction rub. No gallop.      Comments: Regular rate and rhythm.  No murmurs, rubs, gallops. Pulmonary:     Effort: Pulmonary effort is normal. No respiratory distress.     Breath sounds: Normal breath sounds. No stridor. No wheezing, rhonchi or rales.     Comments: Lungs are clear to auscultation bilaterally. Abdominal:     General: Abdomen is flat.     Palpations: Abdomen is soft.     Tenderness: There is no  abdominal tenderness.  Musculoskeletal:        General: Normal range of motion.     Cervical back: Normal range of motion and neck supple. No tenderness.  Skin:    General: Skin is warm and dry.  Neurological:     General: No focal deficit present.     Mental Status: He is alert and oriented to person, place, and time.     Comments: Patient is oriented to person, place, and time. Patient phonates in clear, complete, and coherent sentences. Negative arm drift. Finger to nose intact bilaterally with no visible signs of dysmetria. Strength is 5/5 in the right upper and lower extremity.  Strength is 4/5 in the left upper and lower extremity.    Psychiatric:        Mood and Affect: Mood normal.        Behavior: Behavior normal.    ED Results / Procedures / Treatments   Labs (all labs ordered are listed, but only abnormal results are displayed) Labs Reviewed  BASIC METABOLIC PANEL - Abnormal; Notable for the following components:      Result Value   Glucose, Bld 209 (*)    All other components within normal limits  CBG MONITORING, ED - Abnormal; Notable for the following components:   Glucose-Capillary 144 (*)    All other components within normal limits  SARS CORONAVIRUS 2 BY RT PCR (HOSPITAL ORDER, PERFORMED IN [facility])  CBC  URINALYSIS, ROUTINE W REFLEX MICROSCOPIC  CBG MONITORING, ED   EKG EKG Interpretation  Date/Time:  Wednesday February 01 2020 10:38:48 EDT Ventricular Rate:  81 PR Interval:  160 QRS Duration: 80 QT Interval:  408 QTC Calculation: 473 R Axis:   9 Text Interpretation: Normal sinus rhythm Normal ECG Confirmed by Margarita Grizzle [phone]) on 02/01/2020 3:15:32 PM  Radiology CT Head Wo Contrast  Result Date: 02/01/2020 CLINICAL DATA:  Generalized weakness, fatigue, dizziness, left arm weakness EXAM: CT HEAD WITHOUT CONTRAST TECHNIQUE: Contiguous axial images were obtained from the base of the skull through the vertex without intravenous contrast.  COMPARISON:  None. FINDINGS: Brain: Normal anatomic configuration. No abnormal intra or extra-axial mass lesion or fluid collection. No abnormal mass effect or midline shift. No evidence of acute intracranial hemorrhage or infarct. Ventricular size is normal. Cerebellum unremarkable. Vascular: Unremarkable Skull: Intact Sinuses/Orbits: Paranasal sinuses are clear. Orbits are unremarkable. Other: Mastoid air cells and middle ear cavities are clear. Multiple periapical abscesses are noted involving the visualized maxillary dentition IMPRESSION: No acute intracranial abnormality. Multiple periapical abscesses involving the visualized maxillary dentition Electronically Signed   By: Gloris Ham  Ramiro HarvestParikh MD   On: 02/01/2020 17:21   MR BRAIN WO CONTRAST  Result Date: 02/01/2020 CLINICAL DATA:  Provided history: 2 days of left-sided weakness EXAM: MRI HEAD WITHOUT CONTRAST TECHNIQUE: Multiplanar, multiecho pulse sequences of the brain and surrounding structures were obtained without intravenous contrast. COMPARISON:  Head CT performed earlier the same day 02/01/2020 FINDINGS: Brain: The examination is intermittently motion degraded. Most notably, there is severe motion degradation of the coronal T2 weighted sequence. Cerebral volume is normal for age. There is an 11 mm acute infarct within the paramedian right pons (series 5, images 65-67). Corresponding T2/FLAIR hyperintensity at this site. Mild patchy T2/FLAIR hyperintensity within the cerebral white matter is nonspecific, but consistent with chronic small vessel ischemic disease. No evidence of intracranial mass. No chronic intracranial blood products are identified. No extra-axial fluid collection. No midline shift. Vascular: Expected proximal arterial flow voids. Skull and upper cervical spine: No focal marrow lesion. Sinuses/Orbits: Visualized orbits show no acute finding. Small right maxillary sinus mucous retention cyst. No significant mastoid effusion. IMPRESSION:  Motion degraded examination as described. 11 mm acute infarct within the paramedian right pons. Mild chronic small vessel ischemic changes within the cerebral white matter. Small right maxillary sinus mucous retention cyst Electronically Signed   By: Jackey LogeKyle  Golden DO   On: 02/01/2020 20:36   Procedures Procedures   Medications Ordered in ED Medications   stroke: mapping our early stages of recovery book (0 each Does not apply Hold 02/01/20 2206)  0.9 %  sodium chloride infusion (has no administration in time range)  acetaminophen (TYLENOL) tablet 650 mg (has no administration in time range)    Or  acetaminophen (TYLENOL) 160 MG/5ML solution 650 mg (has no administration in time range)    Or  acetaminophen (TYLENOL) suppository 650 mg (has no administration in time range)  senna-docusate (Senokot-S) tablet 1 tablet (has no administration in time range)  enoxaparin (LOVENOX) injection 50 mg (has no administration in time range)  insulin aspart (novoLOG) injection 0-9 Units (has no administration in time range)  insulin aspart (novoLOG) injection 0-5 Units (has no administration in time range)  aspirin suppository 300 mg (has no administration in time range)    Or  aspirin tablet 325 mg (has no administration in time range)    ED Course  I have reviewed the triage vital signs and the nursing notes.  Pertinent labs & imaging results that were available during my care of the patient were reviewed by me and considered in my medical decision making (see chart for details).  Clinical Course as of Feb 01 2143  Wed Feb 01, 2020  1737 No acute intracranial abnormality.  Multiple periapical abscesses involving the maxillary dentition    CT Head Wo Contrast [LJ]  1738 Glucose(!): 209 [LJ]  2114 Motion degraded examination as described.  11 mm acute infarct within the paramedian right pons.  Mild chronic small vessel ischemic changes within the cerebral white matter.  Small right  maxillary sinus mucous retention cyst    MR BRAIN WO CONTRAST [LJ]    Clinical Course User Index [LJ] Placido SouJoldersma, Mckaylin Bastien, PA-C   MDM Rules/Calculators/A&P                          Pt is a 54 y.o. male that present with a history, physical exam, ED Clinical Course as noted above.   Patient has a history of DM and hypertension presents today due to 2 days of left-sided  weakness.  CT of the head was obtained which showed periapical abscesses but no acute intracranial abnormality.  My attending physician Dr. Melene Plan also evaluated the patient and agrees that the patient is acutely weak on the left side.  Thus, we obtained an MRI of the brain.  This showed an 11 mm acute infarct within the paramedian right pons.  Mild chronic small vessel ischemic changes within the cerebral white matter.  I discussed with neurology and they request that we admit with the hospitalist team and they will consult.  COVID-19 test ordered.  Note: Portions of this report may have been transcribed using voice recognition software. Every effort was made to ensure accuracy; however, inadvertent computerized transcription errors may be present.   Final Clinical Impression(s) / ED Diagnoses Final diagnoses:  Cerebrovascular accident (CVA), unspecified mechanism (HCC)  Dental abscess  Left arm weakness  Left leg weakness   Rx / DC Orders ED Discharge Orders    None       Placido Sou, PA-C 02/01/20 2216    Melene Plan, DO 02/01/20 2224

## 2020-02-01 NOTE — ED Triage Notes (Addendum)
Pt arrives via [facility] (homeless) pt c/o generalized weakness/fatigue x3 days, states that symptoms started Monday when he was at work and got dizzy, states that left arm has been feeling weak since then as well, pt has hx of diabetes, has not been on meds. Pt is hypertensive and hyperglycemia. EMS VS, BP 176/100, rr 24, 100% ra, HR 72, CBG 295. A/ox4, speech clear, face symmetrical, moves all limbs equally. Reports hx of increased falls due to left knee giving out.

## 2020-02-01 NOTE — ED Notes (Signed)
Pt transported to MRI at this time 

## 2020-02-01 NOTE — Consult Note (Signed)
Referring Physician: Dr. Antionette Char    Chief Complaint: Left sided weakness  HPI: Donald Malone is an 54 y.o. homeless male with a history of HTN, COPD and DM who presented to the ED via EMS with complaints of new onset LUE and LLE weakness on a background of new generalized weakness and fatigue. His symptoms began on Monday while he was at work when he became dizzy, felt hot and sweaty and then fell down. Since then he has had trouble lifting his LLE and using his LUE, but the weakness has improved somewhat since the initial, abrupt onset. He states that the weakness affects his LLE worse than his LUE - he has fallen due to his left knee giving out. He also endorses some binocular double vision without a discrete visual field cut. Per EMS, his vitals were BP 176/100, RR 24, sats 100% on RA, HR 72, CBG 295. He has not been able to take his medications. 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 EKG was with normal sinus rhythm.   MRI brain: --11 mm acute infarct within the paramedian right pons. --Mild chronic small vessel ischemic changes within the cerebral white matter.  LSN: Monday tPA Given: No: Out of the time window.   Past Medical History:  Diagnosis Date  . COPD (chronic obstructive pulmonary disease) (HCC)   . Diabetes mellitus without complication (HCC)   . Hypertension     History reviewed. No pertinent surgical history.  No family history on file. Social History:  reports that he has been smoking. He has been smoking about 1.00 pack per day. He has never used smokeless tobacco. No history on file for alcohol use and drug use.  Allergies: No Known Allergies  Home Medications: No current facility-administered medications on file prior to encounter.   Current Outpatient Medications on File Prior to Encounter  Medication Sig Dispense Refill  . enalapril (VASOTEC) 5 MG tablet Take 1 tablet (5 mg total) by mouth daily. (Patient not taking: Reported on 09/26/2019)  30 tablet 0  . enalapril (VASOTEC) 5 MG tablet Take 1 tablet (5 mg total) by mouth daily. 90 tablet 0  . metFORMIN (GLUCOPHAGE) 500 MG tablet Take 1 tablet (500 mg total) by mouth 2 (two) times daily with a meal. 180 tablet 0    ROS: No fevers, dysphagia, speech deficit or headache. Also with no CP, cough or SOB. Other symptoms as per HPI with comprehensive ROS otherwise negative.   Physical Examination: Blood pressure (!) 164/102, pulse 61, temperature 98.2 F (36.8 C), resp. rate 16, SpO2 97 %.  HEENT: [place]/AT Lungs: Respirations unlabored Ext: No edema  Neurologic Examination: Mental Status: Alert, oriented, thought content appropriate.  Speech fluent without evidence of aphasia.  Able to follow all commands without difficulty. Dysarthria is noted.  Cranial Nerves: II:  Visual fields intact with no extinction to DSS. PERRL.  III,IV, VI: No ptosis. No nystagmus. EOMI.  V,VII: Left facial droop. Facial temp sensation equal bilaterally VIII: Hearing intact to voice IX,X: Mild pharyngeal dysarthria.  XI: Lag on the left with shoulder shrug XII: Mild lingual dysarthria.  Motor: RUE 5/5 RLE 5/5 LUE 4-/5 proximally and distally LLE 4+/5 Sensory: Temp and light touch intact in BUE and BLE without asymmetry Deep Tendon Reflexes:  2+ bilateral biceps and brachioradialis 3+ left patellar, 4+ right patellar (crossed adductor) Plantars: Right: downgoing   Left: upgoing Cerebellar: No ataxia with FNF bilaterally  Gait: Deferred  Results for orders placed or performed during the  hospital encounter of 02/01/20 (from the past 48 hour(s))  Basic metabolic panel     Status: Abnormal   Collection Time: 02/01/20 10:43 AM  Result Value Ref Range   Sodium 141 135 - 145 mmol/L   Potassium 4.0 3.5 - 5.1 mmol/L   Chloride 107 98 - 111 mmol/L   CO2 23 22 - 32 mmol/L   Glucose, Bld 209 (H) 70 - 99 mg/dL    Comment: Glucose reference range applies only to samples taken after fasting for at least  8 hours.   BUN 14 6 - 20 mg/dL   Creatinine, Ser 4.01 0.61 - 1.24 mg/dL   Calcium 8.9 8.9 - 02.7 mg/dL   GFR calc non Af Amer >60 >60 mL/min   GFR calc Af Amer >60 >60 mL/min   Anion gap 11 5 - 15    Comment: Performed at [facility], [address] [place], [place] [address]  CBC     Status: None   Collection Time: 02/01/20 10:43 AM  Result Value Ref Range   WBC 4.7 4.0 - 10.5 K/uL   RBC 4.88 4.22 - 5.81 MIL/uL   Hemoglobin 14.1 13.0 - 17.0 g/dL   HCT 44.0 39 - 52 %   MCV 87.5 80.0 - 100.0 fL   MCH 28.9 26.0 - 34.0 pg   MCHC 33.0 30.0 - 36.0 g/dL   RDW 34.7 42.5 - 95.6 %   Platelets 156 150 - 400 K/uL   nRBC 0.0 0.0 - 0.2 %    Comment: Performed at [facility], [address], [place], [place] [address]  CBG monitoring, ED     Status: Abnormal   Collection Time: 02/01/20  5:41 PM  Result Value Ref Range   Glucose-Capillary 144 (H) 70 - 99 mg/dL    Comment: Glucose reference range applies only to samples taken after fasting for at least 8 hours.  Urinalysis, Routine w reflex microscopic     Status: None   Collection Time: 02/01/20  8:55 PM  Result Value Ref Range   Color, Urine YELLOW YELLOW   APPearance CLEAR CLEAR   Specific Gravity, Urine 1.023 1.005 - 1.030   pH 5.0 5.0 - 8.0   Glucose, UA NEGATIVE NEGATIVE mg/dL   Hgb urine dipstick NEGATIVE NEGATIVE   Bilirubin Urine NEGATIVE NEGATIVE   Ketones, ur NEGATIVE NEGATIVE mg/dL   Protein, ur NEGATIVE NEGATIVE mg/dL   Nitrite NEGATIVE NEGATIVE   Leukocytes,Ua NEGATIVE NEGATIVE    Comment: Performed at [facility], [address], [place], [place] [address]   CT Head Wo Contrast  Result Date: 02/01/2020 CLINICAL DATA:  Generalized weakness, fatigue, dizziness, left arm weakness EXAM: CT HEAD WITHOUT CONTRAST TECHNIQUE: Contiguous axial images were obtained from the base of the skull through the vertex without intravenous contrast. COMPARISON:  None. FINDINGS: Brain: Normal anatomic configuration. No  abnormal intra or extra-axial mass lesion or fluid collection. No abnormal mass effect or midline shift. No evidence of acute intracranial hemorrhage or infarct. Ventricular size is normal. Cerebellum unremarkable. Vascular: Unremarkable Skull: Intact Sinuses/Orbits: Paranasal sinuses are clear. Orbits are unremarkable. Other: Mastoid air cells and middle ear cavities are clear. Multiple periapical abscesses are noted involving the visualized maxillary dentition IMPRESSION: No acute intracranial abnormality. Multiple periapical abscesses involving the visualized maxillary dentition Electronically Signed   By: Helyn Numbers MD   On: 02/01/2020 17:21   MR BRAIN WO CONTRAST  Result Date: 02/01/2020 CLINICAL DATA:  Provided history:  2 days of left-sided weakness EXAM: MRI HEAD WITHOUT CONTRAST TECHNIQUE: Multiplanar, multiecho pulse sequences of the brain and surrounding structures were obtained without intravenous contrast. COMPARISON:  Head CT performed earlier the same day 02/01/2020 FINDINGS: Brain: The examination is intermittently motion degraded. Most notably, there is severe motion degradation of the coronal T2 weighted sequence. Cerebral volume is normal for age. There is an 11 mm acute infarct within the paramedian right pons (series 5, images 65-67). Corresponding T2/FLAIR hyperintensity at this site. Mild patchy T2/FLAIR hyperintensity within the cerebral white matter is nonspecific, but consistent with chronic small vessel ischemic disease. No evidence of intracranial mass. No chronic intracranial blood products are identified. No extra-axial fluid collection. No midline shift. Vascular: Expected proximal arterial flow voids. Skull and upper cervical spine: No focal marrow lesion. Sinuses/Orbits: Visualized orbits show no acute finding. Small right maxillary sinus mucous retention cyst. No significant mastoid effusion. IMPRESSION: Motion degraded examination as described. 11 mm acute infarct within the  paramedian right pons. Mild chronic small vessel ischemic changes within the cerebral white matter. Small right maxillary sinus mucous retention cyst Electronically Signed   By: Jackey Loge DO   On: 02/01/2020 20:36    Assessment: 54 y.o. male presenting with acute right pontine ischemic infarction 1. Exam reveals left sided motor deficits referable to the stroke seen on MRI.  2. Stroke Risk Factors - DM and HTN 3. Regular EtOH use stated by patient as 4 beers daily. He states that he intends to quit. 4. Regular smoker, per patient. He states that he intends to quit.   Recommendations: 1. HgbA1c, fasting lipid panel 2. CTA of head and neck 3. TTE 4. Cardiac telemetry 5. PT consult, OT consult, Speech consult 6. Prophylactic therapy-ASA 325 mg PO qd 7. Would consider starting a statin if he can reliably stop drinking.  8. Smoking and EtOH cessation 9. Frequent neuro checks 10. May need to have a follow up consult with Case Management in order to arrange for insurance coverage and to determine if he is eligible for housing assistance. Additionally, would he be eligible for a [employer] pension and/or VA benefits since he states that he was formerly in [employer]?  11. He has multiple maxillary periapical dental abscesses seen on CT, which may need a dental evaluation and/or antibiotic treatment.  12. Out of permissive HTN time window. BP management, lowering SBP by 15% per day to final goal of 120/80.    @Electronically  signed: Dr. 02/01/2020, 9:54 PM

## 2020-02-01 NOTE — ED Notes (Signed)
Pt transported to MRI 

## 2020-02-02 ENCOUNTER — Observation Stay (HOSPITAL_BASED_OUTPATIENT_CLINIC_OR_DEPARTMENT_OTHER): Payer: Self-pay

## 2020-02-02 ENCOUNTER — Observation Stay (HOSPITAL_COMMUNITY): Payer: Self-pay

## 2020-02-02 DIAGNOSIS — I6389 Other cerebral infarction: Secondary | ICD-10-CM

## 2020-02-02 DIAGNOSIS — F1010 Alcohol abuse, uncomplicated: Secondary | ICD-10-CM

## 2020-02-02 DIAGNOSIS — F1410 Cocaine abuse, uncomplicated: Secondary | ICD-10-CM

## 2020-02-02 DIAGNOSIS — R7989 Other specified abnormal findings of blood chemistry: Secondary | ICD-10-CM

## 2020-02-02 DIAGNOSIS — I651 Occlusion and stenosis of basilar artery: Secondary | ICD-10-CM

## 2020-02-02 DIAGNOSIS — I639 Cerebral infarction, unspecified: Secondary | ICD-10-CM | POA: Diagnosis present

## 2020-02-02 DIAGNOSIS — E1165 Type 2 diabetes mellitus with hyperglycemia: Secondary | ICD-10-CM

## 2020-02-02 LAB — LIPID PANEL
Cholesterol: 181 mg/dL (ref 0–200)
HDL: 48 mg/dL (ref >40)
LDL Cholesterol: 108 mg/dL — ABNORMAL HIGH (ref 0–99)
Total CHOL/HDL Ratio: 3.8 RATIO
Triglycerides: 127 mg/dL (ref <150)
VLDL: 25 mg/dL (ref 0–40)

## 2020-02-02 LAB — CBC
HCT: 41.3 % (ref 39.0–52.0)
Hemoglobin: 13.3 g/dL (ref 13.0–17.0)
MCH: 28.5 pg (ref 26.0–34.0)
MCHC: 32.2 g/dL (ref 30.0–36.0)
MCV: 88.6 fL (ref 80.0–100.0)
Platelets: 145 10*3/uL — ABNORMAL LOW (ref 150–400)
RBC: 4.66 MIL/uL (ref 4.22–5.81)
RDW: 13.5 % (ref 11.5–15.5)
WBC: 3.9 10*3/uL — ABNORMAL LOW (ref 4.0–10.5)
nRBC: 0 % (ref 0.0–0.2)

## 2020-02-02 LAB — RAPID URINE DRUG SCREEN, HOSP PERFORMED
Amphetamines: NOT DETECTED
Barbiturates: NOT DETECTED
Benzodiazepines: NOT DETECTED
Cocaine: POSITIVE — AB
Opiates: NOT DETECTED
Tetrahydrocannabinol: NOT DETECTED

## 2020-02-02 LAB — CBG MONITORING, ED
Glucose-Capillary: 132 mg/dL — ABNORMAL HIGH (ref 70–99)
Glucose-Capillary: 250 mg/dL — ABNORMAL HIGH (ref 70–99)

## 2020-02-02 LAB — HEPATITIS PANEL, ACUTE
HCV Ab: NONREACTIVE
Hep A IgM: NONREACTIVE
Hep B C IgM: NONREACTIVE
Hepatitis B Surface Ag: NONREACTIVE

## 2020-02-02 LAB — GLUCOSE, CAPILLARY: Glucose-Capillary: 108 mg/dL — ABNORMAL HIGH (ref 70–99)

## 2020-02-02 LAB — COMPREHENSIVE METABOLIC PANEL
ALT: 135 U/L — ABNORMAL HIGH (ref 0–44)
AST: 141 U/L — ABNORMAL HIGH (ref 15–41)
Albumin: 3.2 g/dL — ABNORMAL LOW (ref 3.5–5.0)
Alkaline Phosphatase: 68 U/L (ref 38–126)
Anion gap: 10 (ref 5–15)
BUN: 13 mg/dL (ref 6–20)
CO2: 26 mmol/L (ref 22–32)
Calcium: 8.9 mg/dL (ref 8.9–10.3)
Chloride: 105 mmol/L (ref 98–111)
Creatinine, Ser: 1.08 mg/dL (ref 0.61–1.24)
GFR calc Af Amer: >60 mL/min (ref >60)
GFR calc non Af Amer: >60 mL/min (ref >60)
Glucose, Bld: 185 mg/dL — ABNORMAL HIGH (ref 70–99)
Potassium: 3.5 mmol/L (ref 3.5–5.1)
Sodium: 141 mmol/L (ref 135–145)
Total Bilirubin: 0.4 mg/dL (ref 0.3–1.2)
Total Protein: 6.3 g/dL — ABNORMAL LOW (ref 6.5–8.1)

## 2020-02-02 LAB — CK: Total CK: 189 U/L (ref 49–397)

## 2020-02-02 LAB — HEMOGLOBIN A1C
Hgb A1c MFr Bld: 7.8 % — ABNORMAL HIGH (ref 4.8–5.6)
Mean Plasma Glucose: 177.16 mg/dL

## 2020-02-02 LAB — HIV ANTIBODY (ROUTINE TESTING W REFLEX): HIV Screen 4th Generation wRfx: NONREACTIVE

## 2020-02-02 LAB — ECHOCARDIOGRAM COMPLETE

## 2020-02-02 MED ORDER — ASPIRIN EC 325 MG PO TBEC
325.0000 mg | DELAYED_RELEASE_TABLET | Freq: Every day | ORAL | Status: DC
  Administered 2020-02-03 – 2020-02-08 (×6): 325 mg via ORAL
  Filled 2020-02-02 (×6): qty 1

## 2020-02-02 MED ORDER — CLOPIDOGREL BISULFATE 75 MG PO TABS
75.0000 mg | ORAL_TABLET | Freq: Every day | ORAL | Status: DC
  Administered 2020-02-02 – 2020-02-08 (×7): 75 mg via ORAL
  Filled 2020-02-02 (×7): qty 1

## 2020-02-02 MED ORDER — ATORVASTATIN CALCIUM 80 MG PO TABS: 80.0000 mg | ORAL_TABLET | Freq: Every day | ORAL | Status: DC

## 2020-02-02 MED ORDER — IOHEXOL 350 MG/ML SOLN
75.0000 mL | Freq: Once | INTRAVENOUS | Status: AC | PRN
  Administered 2020-02-02: 75 mL via INTRAVENOUS

## 2020-02-02 MED ORDER — ASPIRIN EC 81 MG PO TBEC
81.0000 mg | DELAYED_RELEASE_TABLET | Freq: Every day | ORAL | Status: DC
  Administered 2020-02-02: 81 mg via ORAL
  Filled 2020-02-02: qty 1

## 2020-02-02 NOTE — ED Notes (Signed)
Breakfast Ordered--Makenzie Weisner  

## 2020-02-02 NOTE — Progress Notes (Signed)
STROKE TEAM PROGRESS NOTE   INTERVAL HISTORY Pt lying in bed, awake alert, still has left hand weakness, denies any diplopia. He has left knee pain on walking which likely cause some left knee weakness. The reason he has left knee pain is likely [place] and landed on his left knee. The pain on left knee is better than yesterday but still painful. He admitted cocaine use, but denies smoking cigarettes. He admitted alcohol use but small amount.   Vitals:   02/01/20 1301 02/01/20 1506 02/02/20 0339 02/02/20 0753  BP: (!) 160/79 (!) 164/102 136/76 116/77  Pulse: (!) 45 61 73 61  Resp: 18 16 16 16   Temp:      SpO2: 100% 97% 99% 98%   CBC:  Recent Labs  Lab 02/01/20 1043 02/02/20 0423  WBC 4.7 3.9*  HGB 14.1 13.3  HCT 42.7 41.3  MCV 87.5 88.6  PLT 156 145*   Basic Metabolic Panel:  Recent Labs  Lab 02/01/20 1043 02/02/20 0423  NA 141 141  K 4.0 3.5  CL 107 105  CO2 23 26  GLUCOSE 209* 185*  BUN 14 13  CREATININE 1.19 1.08  CALCIUM 8.9 8.9   Lipid Panel:  Recent Labs  Lab 02/02/20 0423  CHOL 181  TRIG 127  HDL 48  CHOLHDL 3.8  VLDL 25  LDLCALC 02/04/20*   HgbA1c:  Recent Labs  Lab 02/02/20 0423  HGBA1C 7.8*   Urine Drug Screen: No results for input(s): LABOPIA, COCAINSCRNUR, LABBENZ, AMPHETMU, THCU, LABBARB in the last 168 hours.  Alcohol Level No results for input(s): ETH in the last 168 hours.  IMAGING past 24 hours CT Head Wo Contrast  Result Date: 02/01/2020 CLINICAL DATA:  Generalized weakness, fatigue, dizziness, left arm weakness EXAM: CT HEAD WITHOUT CONTRAST TECHNIQUE: Contiguous axial images were obtained from the base of the skull through the vertex without intravenous contrast. COMPARISON:  None. FINDINGS: Brain: Normal anatomic configuration. No abnormal intra or extra-axial mass lesion or fluid collection. No abnormal mass effect or midline shift. No evidence of acute intracranial hemorrhage or infarct.  Ventricular size is normal. Cerebellum unremarkable. Vascular: Unremarkable Skull: Intact Sinuses/Orbits: Paranasal sinuses are clear. Orbits are unremarkable. Other: Mastoid air cells and middle ear cavities are clear. Multiple periapical abscesses are noted involving the visualized maxillary dentition IMPRESSION: No acute intracranial abnormality. Multiple periapical abscesses involving the visualized maxillary dentition Electronically Signed   By: 02/03/2020 MD   On: 02/01/2020 17:21   MR ANGIO HEAD WO CONTRAST  Result Date: 02/02/2020 CLINICAL DATA:  Initial evaluation for generalized weakness, fatigue, left-sided weakness. Known stroke on prior brain MRI. EXAM: MRA HEAD WITHOUT CONTRAST TECHNIQUE: Angiographic images of the Circle of Willis were obtained using MRA technique without intravenous contrast. COMPARISON:  Prior brain MRI from earlier the same day. FINDINGS: ANTERIOR CIRCULATION: Examination mildly degraded by motion artifact. Visualized distal cervical segments of the internal carotid arteries are patent with antegrade flow. Petrous segments widely patent. Scattered atheromatous irregularity within the cavernous/supraclinoid ICAs without high-grade stenosis. A1 segments patent bilaterally. Normal anterior communicating artery complex. Focal severe stenosis involving the mid left A2 segment is seen (series 5, image 120). Left ACA perfused distally. Scattered atheromatous irregularity within the right ACA without high-grade stenosis. M1 segments patent without high-grade stenosis. Normal MCA bifurcations. Moderate distal small vessel atheromatous irregularity seen throughout the MCA branches bilaterally. POSTERIOR CIRCULATION: Vertebral arteries widely patent to the vertebrobasilar junction. Left vertebral  slightly dominant. Neither PICA well visualized. Short-segment moderate stenosis noted within the mid basilar artery (series 5, image 76). Basilar patent distally. Superior cerebral arteries  patent bilaterally. Both PCAs primarily supplied via the basilar. Mild atheromatous irregularity throughout both PCAs without high-grade stenosis. No intracranial aneurysm. IMPRESSION: 1. Negative intracranial MRA for large vessel occlusion. 2. Short-segment moderate stenosis within the mid basilar artery. 3. Severe near occlusive left A2 stenosis. 4. Moderate distal small vessel atheromatous irregularity throughout the intracranial circulation. Electronically Signed   By: Rise Mu M.D.   On: 02/02/2020 00:43   MR BRAIN WO CONTRAST  Result Date: 02/01/2020 CLINICAL DATA:  Provided history: 2 days of left-sided weakness EXAM: MRI HEAD WITHOUT CONTRAST TECHNIQUE: Multiplanar, multiecho pulse sequences of the brain and surrounding structures were obtained without intravenous contrast. COMPARISON:  Head CT performed earlier the same day 02/01/2020 FINDINGS: Brain: The examination is intermittently motion degraded. Most notably, there is severe motion degradation of the coronal T2 weighted sequence. Cerebral volume is normal for age. There is an 11 mm acute infarct within the paramedian right pons (series 5, images 65-67). Corresponding T2/FLAIR hyperintensity at this site. Mild patchy T2/FLAIR hyperintensity within the cerebral white matter is nonspecific, but consistent with chronic small vessel ischemic disease. No evidence of intracranial mass. No chronic intracranial blood products are identified. No extra-axial fluid collection. No midline shift. Vascular: Expected proximal arterial flow voids. Skull and upper cervical spine: No focal marrow lesion. Sinuses/Orbits: Visualized orbits show no acute finding. Small right maxillary sinus mucous retention cyst. No significant mastoid effusion. IMPRESSION: Motion degraded examination as described. 11 mm acute infarct within the paramedian right pons. Mild chronic small vessel ischemic changes within the cerebral white matter. Small right maxillary sinus  mucous retention cyst Electronically Signed   By: Jackey Loge DO   On: 02/01/2020 20:36   ECHOCARDIOGRAM COMPLETE  Result Date: 02/02/2020    ECHOCARDIOGRAM REPORT   Patient Name:   [name] Date of Exam: 02/02/2020 Medical Rec #:  [id number]           Height:       62.0 in Accession #:    [id number]          Weight:       225.0 lb Date of Birth:  1966-07-02           BSA:          2.010 m Patient Age:    54 years            BP:           116/77 mmHg Patient Gender: M                   HR:           55 bpm. Exam Location:  Inpatient Procedure: 2D Echo, Color Doppler and Cardiac Doppler Indications:    Stroke i163.9  History:        Patient has no prior history of Echocardiogram examinations.                 COPD; Risk Factors:Hypertension and Diabetes.  Sonographer:    Irving Burton Senior RDCS Referring Phys: [id number] TIMOTHY S OPYD IMPRESSIONS  1. Left ventricular ejection fraction, by estimation, is 50 to 55%. The left ventricle has low normal function. The left ventricle demonstrates regional wall motion abnormalities (see scoring diagram/findings for description). Inferior hypokinesis. Lateral wall not well visalized. There is mild left ventricular hypertrophy. Left ventricular  diastolic parameters are consistent with Grade III diastolic dysfunction (restrictive). Elevated left atrial pressure.  2. Right ventricular systolic function is normal. The right ventricular size is normal. Tricuspid regurgitation signal is inadequate for assessing PA pressure.  3. Left atrial size was mildly dilated.  4. The mitral valve is normal in structure. No evidence of mitral valve regurgitation.  5. The aortic valve was not well visualized. Aortic valve regurgitation is not visualized. No aortic stenosis is present.  6. The inferior vena cava is dilated in size with >50% respiratory variability, suggesting right atrial pressure of 8 mmHg. FINDINGS  Left Ventricle: Left ventricular ejection fraction, by estimation, is 50 to  55%. The left ventricle has low normal function. The left ventricle demonstrates regional wall motion abnormalities. The left ventricular internal cavity size was normal in size. There is mild left ventricular hypertrophy. Left ventricular diastolic parameters are consistent with Grade III diastolic dysfunction (restrictive). Elevated left atrial pressure. Right Ventricle: The right ventricular size is normal. Right vetricular wall thickness was not assessed. Right ventricular systolic function is normal. Tricuspid regurgitation signal is inadequate for assessing PA pressure. Left Atrium: Left atrial size was mildly dilated. Right Atrium: Right atrial size was normal in size. Pericardium: Trivial pericardial effusion is present. Mitral Valve: The mitral valve is normal in structure. No evidence of mitral valve regurgitation. Tricuspid Valve: The tricuspid valve is normal in structure. Tricuspid valve regurgitation is not demonstrated. Aortic Valve: The aortic valve was not well visualized. Aortic valve regurgitation is not visualized. No aortic stenosis is present. Pulmonic Valve: The pulmonic valve was not well visualized. Pulmonic valve regurgitation is not visualized. Aorta: The aortic root and ascending aorta are structurally normal, with no evidence of dilitation. Venous: The inferior vena cava is dilated in size with greater than 50% respiratory variability, suggesting right atrial pressure of 8 mmHg. IAS/Shunts: The interatrial septum was not well visualized.  LEFT VENTRICLE PLAX 2D LVIDd:         5.00 cm  Diastology LVIDs:         3.40 cm  LV e' lateral:   5.26 cm/s LV PW:         1.20 cm  LV E/e' lateral: 16.0 LV IVS:        1.30 cm  LV e' medial:    4.99 cm/s LVOT diam:     1.90 cm  LV E/e' medial:  16.8 LV SV:         56 LV SV Index:   28 LVOT Area:     2.84 cm  RIGHT VENTRICLE RV S prime:     10.70 cm/s TAPSE (M-mode): 2.4 cm LEFT ATRIUM             Index       RIGHT ATRIUM           Index LA diam:         4.50 cm 2.24 cm/m  RA Area:     15.50 cm LA Vol (A2C):   86.6 ml 43.09 ml/m RA Volume:   44.20 ml  21.99 ml/m LA Vol (A4C):   55.3 ml 27.51 ml/m LA Biplane Vol: 71.0 ml 35.33 ml/m  AORTIC VALVE LVOT Vmax:   95.60 cm/s LVOT Vmean:  64.600 cm/s LVOT VTI:    0.197 m  AORTA Ao Root diam: 3.70 cm Ao Asc diam:  3.40 cm MITRAL VALVE MV Area (PHT): 3.77 cm    SHUNTS MV Decel Time: 201 msec    Systemic  VTI:  0.20 m MV E velocity: 84.00 cm/s  Systemic Diam: 1.90 cm MV A velocity: 36.70 cm/s MV E/A ratio:  2.29 Epifanio Lesches MD Electronically signed by Epifanio Lesches MD Signature Date/Time: 02/02/2020/9:51:37 AM    Final     PHYSICAL EXAM  Temp:  [98.3 F (36.8 C)] 98.3 F (36.8 C) (07/15 1148) Pulse Rate:  [61-73] 70 (07/15 1148) Resp:  [16-20] 20 (07/15 1148) BP: (116-164)/(76-102) 162/88 (07/15 1148) SpO2:  [97 %-99 %] 99 % (07/15 1148)  General - Well nourished, well developed, in no apparent distress.  Ophthalmologic - fundi not visualized due to noncooperation.  Cardiovascular - Regular rhythm and rate.  Mental Status -  Level of arousal and orientation to time, place, and person were intact. Language including expression, naming, repetition, comprehension was assessed and found intact. Fund of Knowledge was assessed and was intact.  Cranial Nerves II - XII - II - Visual field intact OU. III, IV, VI - Extraocular movements intact. V - Facial sensation intact bilaterally. VII - Facial movement intact bilaterally. VIII - Hearing & vestibular intact bilaterally. X - Palate elevates symmetrically. XI - Chin turning & shoulder shrug intact bilaterally. XII - Tongue protrusion intact.  Motor Strength - The patient's strength was normal in all extremities and pronator drift was absent except left hand grip 4/5 and dexterity difficulty. Left knee flexion 4/5 but LLE proximal and distal 5/5. Bulk was normal and fasciculations were absent.   Motor Tone - Muscle tone was  assessed at the neck and appendages and was normal.  Reflexes - The patient's reflexes were symmetrical in all extremities and he had no pathological reflexes.  Sensory - Light touch, temperature/pinprick were assessed and were symmetrical.    Coordination - The patient had normal movements in the right hand with no ataxia or dysmetria. However, left FTN mild dysmetria. Tremor was absent.  Gait and Station - deferred.   ASSESSMENT/PLAN Mr. Donald Malone is a 53 y.o. male with history of HTN, DB, COPD presenting with L sided weakness.   Stroke:   R paramedian pontine infarct in setting of mid BA stenosis, infarct secondary to large vessel disease source  CT head No acute abnormality. Multiple periapical abscesses of maxillary dentition.  MRI  R paramedian pontine infarct. Small vessel disease. Small R maxillary sinus mucous retention cyst.  MRA  No LVO. Mid BA short moderate stenosis. L A2 near occlusion. Widespread moderate small vessel disease.   CTA head moderate mid BA stenosis. Other stenoses:  L A2 high-grade, L P2 moderate, mid R P2 mild to moderate   CTA neck Unremarkable   2D Echo EF 50-55%. No source of embolus. LA mildly dilated  LDL 108  HgbA1c 7.8  VTE prophylaxis - Lovenox 50 mg sq daily   No antithrombotic prior to admission, now on aspirin 325 mg daily and clopidogrel 75 mg daily DAPT for 3 months and then ASA alone due to large vessel stenosis.    Therapy recommendations:  Outpt OT, PT pending   Disposition:  pending   Basilar artery stenosis Intracranial stenosis  MRA  No LVO. Mid BA short moderate stenosis. L A2 near occlusion. Widespread moderate small vessel disease.   CTA head moderate mid BA stenosis. Other stenoses:  L A2 high-grade, L P2 moderate, mid R P2 mild to moderate   Recommend medical management for now with DAPT. Once LFT normal, consider high dose statin.   Cocaine use  Pt admitted cocaine use  UDS  positive for  cocaine  Cessation education provided  Pt is willing to quit  Hypertension  Stable . Permissive hypertension (OK if < 220/120) but gradually normalize in 3-5 days . Long-term BP goal normotensive  Hyperlipidemia  Home meds:  No statin  LDL 108, goal < 70  AST/ALT 141/135  Hepatitis panel neg  No statin at present given elevated LFTs - consider high dose statin once LFT normalizes  Diabetes type II Uncontrolled  HgbA1c 7.8, goal < 7.0  CBGs  SSI  Hyperglycemia  Need close PCP follow up for better DM control.  Other Stroke Risk Factors  Alcohol use - pt stated small amount - however based on AST/ALT, concerning that he has heavy alcohol use - education on limit use of alcohol was provided to him. He expressed understanding  Morbid Obesity, There is no height or weight on file to calculate BMI., recommend weight loss, diet and exercise as appropriate   Other Active Problems  COPD  Homelessness  Hospital day # 0  Neurology will sign off. Please call with questions. [facility]. Thanks for the consult.  Marvel PlanJindong Kilynn Fitzsimmons, MD PhD Stroke Neurology 02/02/2020 2:47 PM    To contact Stroke Continuity provider, please refer to [email or web address]. After hours, contact General Neurology

## 2020-02-02 NOTE — ED Notes (Signed)
Pt transported to echo 

## 2020-02-02 NOTE — ED Notes (Signed)
Pt moved to hospital bed for comfort.

## 2020-02-02 NOTE — Progress Notes (Signed)
Donald Malone  GNO:[id number] DOB: [birth year] DOA: 02/01/2020 PCP: Patient, No Pcp Per    Brief Narrative:  54 year old with a history of COPD, DM2, HTN, and homelessness who presented to the ED with left-sided facial weakness, left arm weakness, and left leg weakness of acute onset 01/30/2020.  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  In the ED CT head was negative for acute intracranial abnormality.  MRI brain suggested an 11 mm acute infarct in the right paramedian pons.  Significant Events: 7/14 admit via [place]  Antimicrobials:  None  Subjective: Resting comfortably on a stretcher in the hallway of the ED.  States that he is beginning to feel better overall.  Denies chest pain shortness of breath nausea or vomiting.  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 Multiple periapical abscesses of maxillary dentition.  MRI  R paramedian pontine infarct. Small vessel disease. Small R maxillary sinus mucous retention cyst.  MRA  No LVO. Mid BA short moderate stenosis. L A2 near occlusion. Widespread moderate small vessel disease.   CTA head moderate mid BA stenosis. Other stenoses:  L A2 high-grade, L P2 moderate, mid R P2 mild to moderate   CTA neck Unremarkable   2D Echo EF 50-55%. No source of embolus. LA mildly dilated  LDL 108  HgbA1c 7.8  VTE prophylaxis - Lovenox 50 mg sq daily   No antithrombotic prior to admission, now on aspirin 325 mg daily and clopidogrel 75 mg daily DAPT for 3 months and then ASA alone due to large vessel stenosis.    Therapy recommendations:  Outpt OT, PT pending   Basilar artery stenosis Stroke Team suggest medical  management for now with dual antiplatelet therapy -consider adding high-dose statin if LFTs normalized  Cocaine abuse UDS positive for cocaine -patient counseled on absolute need to fully abstain from cocaine and states he is willing  DM2 A1c 6.26 September 2019 and 7.8 this admission - not on any medications presently - monitor CBG trend  HLD LDL 108 with goal of less than 70 -unable to initiate statin at present as LFTs are mildly elevated -viral hepatitis panel unrevealing -consider initiating high-dose statin and LFTs normalize  HTN Not on any medications presently -practice permissive hypertension for now but will need to initiate medical therapy in 3 to 5 days if blood pressure remains elevated  Homeless Patient also does not have a PCP or access to medications -TOC consulted  Dental caries Multiple periapical abscesses of maxillary dentition noted on CT imaging  DVT prophylaxis: Lovenox Code Status: FULL CODE Family Communication:  Status is: Inpatient  Remains inpatient appropriate because:Unsafe d/c plan   Dispo: The patient is from: Home              Anticipated d/c is to: unclear              Anticipated d/c date is: 1 day              Patient currently is not medically stable to d/c.  Consultants:  Stroke Team  Objective: Blood pressure 116/77, pulse 61, temperature 98.2 F (36.8 C), resp. rate 16, SpO2 98 %. No  intake or output data in the 24 hours ending 02/02/20 1051 There were no vitals filed for this visit.  Examination: General: No acute respiratory distress Lungs: Clear to auscultation bilaterally without wheezes or crackles Cardiovascular: Regular rate and rhythm without murmur gallop or rub normal S1 and S2 Abdomen: Nontender, nondistended, soft, bowel sounds positive, no rebound, no ascites, no appreciable mass Extremities: No significant cyanosis, clubbing, or edema bilateral lower extremities  CBC: Recent Labs  Lab 02/01/20 1043 02/02/20 0423    WBC 4.7 3.9*  HGB 14.1 13.3  HCT 42.7 41.3  MCV 87.5 88.6  PLT 156 145*   Basic Metabolic Panel: Recent Labs  Lab 02/01/20 1043 02/02/20 0423  NA 141 141  K 4.0 3.5  CL 107 105  CO2 23 26  GLUCOSE 209* 185*  BUN 14 13  CREATININE 1.19 1.08  CALCIUM 8.9 8.9   GFR: CrCl cannot be calculated (Unknown ideal weight.).  Liver Function Tests: Recent Labs  Lab 02/02/20 0423  AST 141*  ALT 135*  ALKPHOS 68  BILITOT 0.4  PROT 6.3*  ALBUMIN 3.2*    Cardiac Enzymes: Recent Labs  Lab 02/02/20 0423  CKTOTAL 189    HbA1C: Hgb A1c MFr Bld  Date/Time Value Ref Range Status  02/02/2020 04:23 AM 7.8 (H) 4.8 - 5.6 % Final    Comment:    (NOTE) Pre diabetes:          5.7%-6.4%  Diabetes:              >6.4%  Glycemic control for   <7.0% adults with diabetes   09/26/2019 08:51 AM 6.8 (H) 4.8 - 5.6 % Final    Comment:    (NOTE) Pre diabetes:          5.7%-6.4% Diabetes:              >6.4% Glycemic control for   <7.0% adults with diabetes     CBG: Recent Labs  Lab 02/01/20 1741 02/01/20 2229 02/02/20 1005  GLUCAP 144* 133* 132*    Recent Results (from the past 240 hour(s))  SARS Coronavirus 2 by RT PCR (hospital order, performed in [facility] hospital lab) Nasopharyngeal Nasopharyngeal Swab     Status: None   Collection Time: 02/01/20  9:27 PM   Specimen: Nasopharyngeal Swab  Result Value Ref Range Status   SARS Coronavirus 2 NEGATIVE NEGATIVE Final    Comment: (NOTE) SARS-CoV-2 target nucleic acids are NOT DETECTED.  The SARS-CoV-2 RNA is generally detectable in upper and lower respiratory specimens during the acute phase of infection. The lowest concentration of SARS-CoV-2 viral copies this assay can detect is 250 copies / mL. A negative result does not preclude SARS-CoV-2 infection and should not be used as the sole basis for treatment or other patient management decisions.  A negative result may occur with improper specimen collection / handling,  submission of specimen other than nasopharyngeal swab, presence of viral mutation(s) within the areas targeted by this assay, and inadequate number of viral copies (<250 copies / mL). A negative result must be combined with clinical observations, patient history, and epidemiological information.  Fact Sheet for Patients:   BoilerBrush.com.cy  Fact Sheet for Healthcare Providers: https://pope.com/  This test is not yet approved or  cleared by the [place] FDA and has been authorized for detection and/or diagnosis of SARS-CoV-2 by FDA under an Emergency Use Authorization (EUA).  This EUA will remain in effect (meaning this test can be used) for the duration  of the COVID-19 declaration under Section 564(b)(1) of the Act, 21 U.S.C. section 360bbb-3(b)(1), unless the authorization is terminated or revoked sooner.  Performed at [facility], [address], [place], [place] [address]      Scheduled Meds: .  stroke: mapping our early stages of recovery book   Does not apply Once  . aspirin EC  81 mg Oral Daily  . clopidogrel  75 mg Oral Daily  . enoxaparin (LOVENOX) injection  50 mg Subcutaneous Q24H  . insulin aspart  0-5 Units Subcutaneous QHS  . insulin aspart  0-9 Units Subcutaneous TID WC     LOS: 0 days   Lonia Blood, MD [facility] Office  [phone] Pager - Text Page per Amion  If 7PM-7AM, please contact night-coverage per Amion 02/02/2020, 10:51 AM

## 2020-02-02 NOTE — ED Notes (Signed)
hospitalist at bedside

## 2020-02-02 NOTE — ED Notes (Signed)
Pt given diet ginger ale.
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
## 2020-02-02 NOTE — ED Notes (Signed)
PT at bedside.

## 2020-02-02 NOTE — Progress Notes (Signed)
Echocardiogram 2D Echocardiogram has been performed.  Warren Lacy Derron Pipkins 02/02/2020, 9:11 AM

## 2020-02-03 LAB — GLUCOSE, CAPILLARY
Glucose-Capillary: 147 mg/dL — ABNORMAL HIGH (ref 70–99)
Glucose-Capillary: 107 mg/dL — ABNORMAL HIGH (ref 70–99)
Glucose-Capillary: 190 mg/dL — ABNORMAL HIGH (ref 70–99)
Glucose-Capillary: 134 mg/dL — ABNORMAL HIGH (ref 70–99)

## 2020-02-03 NOTE — TOC CAGE-AID Note (Signed)
Transition of Care [facility]) - CAGE-AID Screening   Patient Details  Name: Donald Malone MRN: [id number] Date of Birth: June 07, 1966  Transition of Care [facility]) CM/SW Contact:    Emeterio Reeve, [place] Phone Number: 02/03/2020, 4:48 PM   Clinical Narrative:  CSW met with pt at bedside. CSW introduced self and explained her role at the hospital.  Pt reports he used to drink alcohol but hasntt in months. Pt reports that he used cocaine a few days ago. Pt states he does not use It regularly but was celebrating and did it with friends. Pt was receptive to counseling but declined resources.   CAGE-AID Screening:    Have You Ever Felt You Ought to Cut Down on Your Drinking or Drug Use?: Yes Have People Annoyed You By Critizing Your Drinking Or Drug Use?: No Have You Felt Bad Or Guilty About Your Drinking Or Drug Use?: No Have You Ever Had a Drink or Used Drugs First Thing In The Morning to Steady Your Nerves or to Get Rid of a Hangover?: No CAGE-AID Score: 1  Substance Abuse Education Offered: Yes  Substance abuse interventions: Patient Counseling   Emeterio Reeve, Latanya Presser, [place] Social Worker [phone]

## 2020-02-03 NOTE — Progress Notes (Signed)
Physical Therapy Treatment Patient Details Name: Donald Malone MRN: [id number] DOB: 20-Feb-1966 Today's Date: 02/03/2020    History of Present Illness 54 y.o male presenting with L sided weakness. 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 PMH includes COPD, type 2 diabetes mellitus, hypertension, and homelessness    PT Comments    Patient progressing well towards PT goals. Performed TUG in 15.10 seconds indicating increase risk for falls. Continues to report left knee instability/weakness. Tolerated gait training today without use of DME with close Min guard for safety. Mild instability and imbalance noted. Performed 5xSTS in 14.38 seconds indicating decreased functional strength and mobility. Pt highly motivated to return to PLOF. Instructed pt in functional strengthening exercise. Will follow.   Follow Up Recommendations  Outpatient PT     Equipment Recommendations  None recommended by PT    Recommendations for Other Services       Precautions / Restrictions Precautions Precautions: Fall Restrictions Weight Bearing Restrictions: No    Mobility  Bed Mobility Overal bed mobility: Needs Assistance Bed Mobility: Supine to Sit     Supine to sit: Modified independent (Device/Increase time);HOB elevated     General bed mobility comments: No assist needed.  Transfers Overall transfer level: Needs assistance Equipment used: None Transfers: Sit to/from Stand Sit to Stand: Supervision         General transfer comment: Supervision for safety. Progressing to mod I. Performed 5xSTS from EOB.  Ambulation/Gait Ambulation/Gait assistance: Min guard Gait Distance (Feet): 150 Feet Assistive device: None Gait Pattern/deviations: Step-through pattern;Decreased stride length;Decreased stance time - left Gait velocity: good speed Gait velocity interpretation: 1.31 - 2.62 ft/sec, indicative of limited community ambulator General Gait Details:  Mildly unsteady gait with left knee instability, "feels spongy."  Some incoordination noted with LLE as well. No knee buckling noted.   Stairs             Wheelchair Mobility    Modified Rankin (Stroke Patients Only) Modified Rankin (Stroke Patients Only) Pre-Morbid Rankin Score: No symptoms Modified Rankin: Moderate disability     Balance Overall balance assessment: Needs assistance Sitting-balance support: Feet supported;No upper extremity supported Sitting balance-Leahy Scale: Good Sitting balance - Comments: Able to adjust socks without difficulty.   Standing balance support: During functional activity Standing balance-Leahy Scale: Fair                   Standardized Balance Assessment Standardized Balance Assessment : TUG: Timed Up and Go Test     Timed Up and Go Test TUG: Normal TUG Normal TUG (seconds): 15.1    Cognition Arousal/Alertness: Awake/alert Behavior During Therapy: WFL for tasks assessed/performed Overall Cognitive Status: Within Functional Limits for tasks assessed                                        Exercises      General Comments General comments (skin integrity, edema, etc.): 5xSTS performed in 14.38 seconds (cutoff score of 12 sec) indicates decreased functional strength and mobility.      Pertinent Vitals/Pain Pain Assessment: No/denies pain    Home Living       Type of Home: Homeless              Prior Function            PT Goals (current goals can now be found in the care plan section)  Progress towards PT goals: Progressing toward goals    Frequency    Min 3X/week      PT Plan Current plan remains appropriate;Frequency needs to be updated    Co-evaluation              AM-PAC PT "6 Clicks" Mobility   Outcome Measure  Help needed turning from your back to your side while in a flat bed without using bedrails?: None Help needed moving from lying on your back to sitting on  the side of a flat bed without using bedrails?: None Help needed moving to and from a bed to a chair (including a wheelchair)?: None Help needed standing up from a chair using your arms (e.g., wheelchair or bedside chair)?: None Help needed to walk in hospital room?: A Little Help needed climbing 3-5 steps with a railing? : A Little 6 Click Score: 22    End of Session Equipment Utilized During Treatment: Gait belt Activity Tolerance: Patient tolerated treatment well Patient left: in bed;with call bell/phone within reach (sitting EOB) Nurse Communication: Mobility status PT Visit Diagnosis: Unsteadiness on feet (R26.81)     Time: 1026-1050 PT Time Calculation (min) (ACUTE ONLY): 24 min  Charges:  $Gait Training: 8-22 mins $Therapeutic Activity: 8-22 mins                     Vale Haven, PT, DPT Acute Rehabilitation Services Pager [phone] Office [phone]       Blake Divine A Lanier Ensign 02/03/2020, 12:16 PM

## 2020-02-03 NOTE — Plan of Care (Signed)
  Problem: Education: Goal: Knowledge of disease or condition will improve Outcome: Progressing   Problem: Coping: Goal: Will verbalize positive feelings about self Outcome: Progressing   Problem: Self-Care: Goal: Ability to participate in self-care as condition permits will improve Outcome: Progressing   Problem: Nutrition: Goal: Risk of aspiration will decrease Outcome: Progressing

## 2020-02-03 NOTE — Progress Notes (Deleted)
Homeless - needs shelter
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
## 2020-02-03 NOTE — Care Management (Signed)
ED CM consulted that patient needs assistance with Medication, patient is eligible for [facility] program, Patient enrolled and Letter given with instruction. Prescriptions were faxed to [facility] on [address]. Patient verbalized understanding

## 2020-02-03 NOTE — Progress Notes (Signed)
Occupational Therapy Treatment Patient Details Name: Donald Malone MRN: [id number] DOB: 03-18-1966 Today's Date: 02/03/2020    History of present illness 54 y.o male presenting with L sided weakness. 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 PMH includes COPD, type 2 diabetes mellitus, hypertension, and homelessness   OT comments  Pt seen for folow up OT Session with focus on LUE HEP. Pt given printed [employer] handout to follow with level two theraband. Pt practiced through all exercises, reporting fatigue in shoulder girdle, lat, and bicep. Pt states his muscles are fatiguing much quicker than baseline, and needs to be able to reach [facility] consistently for work. Will continue to follow. D/c recs remain appropriate.    Follow Up Recommendations  Outpatient OT    Equipment Recommendations  None recommended by OT    Recommendations for Other Services      Precautions / Restrictions Precautions Precautions: Fall Restrictions Weight Bearing Restrictions: No       Mobility Bed Mobility Overal bed mobility: Needs Assistance Bed Mobility: Supine to Sit     Supine to sit: Modified independent (Device/Increase time);HOB elevated     General bed mobility comments: sitting EOB  Transfers Overall transfer level: Needs assistance Equipment used: None Transfers: Sit to/from Stand Sit to Stand: Supervision         General transfer comment: Supervision for safety. Progressing to mod I. Performed 5xSTS from EOB.    Balance Overall balance assessment: Needs assistance Sitting-balance support: Feet supported;No upper extremity supported Sitting balance-Leahy Scale: Good Sitting balance - Comments: Able to adjust socks without difficulty.   Standing balance support: During functional activity Standing balance-Leahy Scale: Fair                   Standardized Balance Assessment Standardized Balance Assessment : TUG: Timed Up and Go  Test     Timed Up and Go Test TUG: Normal TUG Normal TUG (seconds): 15.1   ADL either performed or assessed with clinical judgement   ADL Overall ADL's : Needs assistance/impaired                                       General ADL Comments: session focused on LUE HEP with level 2 theraband. Pt given Engineer, manufacturing     Praxis      Cognition Arousal/Alertness: Awake/alert Behavior During Therapy: WFL for tasks assessed/performed Overall Cognitive Status: Within Functional Limits for tasks assessed                                 General Comments: is reporting some mild memory deficits- will continue to assess        Exercises Shoulder Exercises Shoulder Flexion: AAROM;Theraband;5 reps;Seated;Left Theraband Level (Shoulder Flexion): Level 2 (Red) Shoulder Extension: AROM;Left;5 reps;Theraband Theraband Level (Shoulder Extension): Level 2 (Red) Shoulder ABduction: AROM;Left;Theraband;Seated Elbow Flexion: AROM;Left;Theraband;Seated Theraband Level (Elbow Flexion): Level 2 (Red) Elbow Extension: AROM;5 reps (chair push ups)   Shoulder Instructions       General Comments     Pertinent Vitals/ Pain       Pain Assessment: No/denies pain  Home Living  Prior Functioning/Environment              Frequency  Min 2X/week        Progress Toward Goals  OT Goals(current goals can now be found in the care plan section)  Progress towards OT goals: Progressing toward goals  Acute Rehab OT Goals Patient Stated Goal: return to baseline OT Goal Formulation: With patient Time For Goal Achievement: 02/16/20 Potential to Achieve Goals: Good  Plan Discharge plan remains appropriate    Co-evaluation                 AM-PAC OT "6 Clicks" Daily Activity     Outcome Measure   Help from another person eating meals?: None Help from another  person taking care of personal grooming?: None Help from another person toileting, which includes using toliet, bedpan, or urinal?: A Little Help from another person bathing (including washing, rinsing, drying)?: A Little Help from another person to put on and taking off regular upper body clothing?: None Help from another person to put on and taking off regular lower body clothing?: A Little 6 Click Score: 21    End of Session    OT Visit Diagnosis: Other abnormalities of gait and mobility (R26.89);Hemiplegia and hemiparesis;Muscle weakness (generalized) (M62.81);History of falling (Z91.81) Hemiplegia - Right/Left: Left Hemiplegia - dominant/non-dominant: Non-Dominant Hemiplegia - caused by: Cerebral infarction   Activity Tolerance Patient tolerated treatment well   Patient Left in bed;with call bell/phone within reach   Nurse Communication Mobility status        Time: 1601-0932 OT Time Calculation (min): 24 min  Charges: OT General Charges $OT Visit: 1 Visit OT Treatments $Therapeutic Activity: 23-37 mins  Dalphine Handing, MSOT, OTR/L Acute Rehabilitation Services [facility] Office Number: [phone] Pager: [phone]  Dalphine Handing 02/03/2020, 2:25 PM

## 2020-02-03 NOTE — Progress Notes (Signed)
Donald Malone  JIR:[id number] DOB: 1966/07/02 DOA: 02/01/2020 PCP: Patient, No Pcp Per    Brief Narrative:  54 year old with a history of COPD, DM2, HTN, and homelessness who presented to the ED with left-sided facial weakness, left arm weakness, and left leg weakness of acute onset 01/30/2020.  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  In the ED CT head was negative for acute intracranial abnormality.  MRI brain suggested an 11 mm acute infarct in the right paramedian pons.  Significant Events: 7/14 admit via [place]  Antimicrobials:  None  Subjective: Resting comfortably in bed.  Feels that his neurologic deficits are slowly improving.  Denies chest pain headache shortness of breath nausea vomiting or abdominal pain.  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 Multiple periapical abscesses of maxillary dentition.  MRI  R paramedian pontine infarct. Small vessel disease. Small R maxillary sinus mucous retention cyst.  MRA  No LVO. Mid BA short moderate stenosis. L A2 near occlusion. Widespread moderate small vessel disease.   CTA head moderate mid BA stenosis. Other stenoses:  L A2 high-grade, L P2 moderate, mid R P2 mild to moderate   CTA neck Unremarkable   2D Echo EF 50-55%. No source of embolus. LA mildly dilated  LDL 108  HgbA1c 7.8  No antithrombotic prior to admission, now on aspirin 325 mg daily and clopidogrel 75 mg daily DAPT for 3 months and then ASA alone due to large vessel stenosis.    Therapy recommendations:  Outpt OT, Outpt PT  Basilar artery stenosis Stroke Team suggest medical management for now with dual antiplatelet therapy  -consider adding high-dose statin if LFTs normalize  Cocaine abuse UDS positive for cocaine -patient counseled on absolute need to fully abstain from cocaine and states he is willing  DM2 A1c 6.26 September 2019 and 7.8 this admission - not on any medications presently -CBG for the most part well controlled at present  HLD LDL 108 with goal of less than 70 -unable to initiate statin at present as LFTs are mildly elevated -viral hepatitis panel unrevealing -consider initiating high-dose statin and LFTs normalize  HTN Not on any medications presently -practice permissive hypertension for now but will need to initiate medical therapy in 3 to 5 days if blood pressure remains elevated  Homeless Patient also does not have a PCP or access to medications -TOC consulted  Dental caries Multiple periapical abscesses of maxillary dentition noted on CT imaging  DVT prophylaxis: Lovenox Code Status: FULL CODE Family Communication:  Status is: Inpatient  Remains inpatient appropriate because:Unsafe d/c plan   Dispo: The patient is from: Home              Anticipated d/c is to: unclear              Anticipated d/c date is: 1 day              Patient currently is not medically stable to d/c.  Consultants:  Stroke Team  Objective: Blood pressure (!) 144/90, pulse (!) 57, temperature 98 F (36.7 C), resp. rate 19, SpO2 99 %. No intake or output data in the 24  hours ending 02/03/20 0924 There were no vitals filed for this visit.  Examination: General: No acute respiratory distress Lungs: CTA B -no wheezing Cardiovascular: Regular rate and rhythm without murmur Abdomen: Nontender, nondistended, soft, bowel sounds positive, no rebound, no ascites, no appreciable mass Extremities: No significant cyanosis, clubbing, or edema bilateral lower extremities  CBC: Recent Labs  Lab 02/01/20 1043 02/02/20 0423  WBC 4.7 3.9*  HGB 14.1 13.3  HCT 42.7 41.3  MCV 87.5 88.6  PLT 156 145*   Basic  Metabolic Panel: Recent Labs  Lab 02/01/20 1043 02/02/20 0423  NA 141 141  K 4.0 3.5  CL 107 105  CO2 23 26  GLUCOSE 209* 185*  BUN 14 13  CREATININE 1.19 1.08  CALCIUM 8.9 8.9   GFR: CrCl cannot be calculated (Unknown ideal weight.).  Liver Function Tests: Recent Labs  Lab 02/02/20 0423  AST 141*  ALT 135*  ALKPHOS 68  BILITOT 0.4  PROT 6.3*  ALBUMIN 3.2*    Cardiac Enzymes: Recent Labs  Lab 02/02/20 0423  CKTOTAL 189    HbA1C: Hgb A1c MFr Bld  Date/Time Value Ref Range Status  02/02/2020 04:23 AM 7.8 (H) 4.8 - 5.6 % Final    Comment:    (NOTE) Pre diabetes:          5.7%-6.4%  Diabetes:              >6.4%  Glycemic control for   <7.0% adults with diabetes   09/26/2019 08:51 AM 6.8 (H) 4.8 - 5.6 % Final    Comment:    (NOTE) Pre diabetes:          5.7%-6.4% Diabetes:              >6.4% Glycemic control for   <7.0% adults with diabetes     CBG: Recent Labs  Lab 02/01/20 2229 02/02/20 1005 02/02/20 1135 02/02/20 2244 02/03/20 0640  GLUCAP 133* 132* 250* 108* 147*    Recent Results (from the past 240 hour(s))  SARS Coronavirus 2 by RT PCR (hospital order, performed in [facility] hospital lab) Nasopharyngeal Nasopharyngeal Swab     Status: None   Collection Time: 02/01/20  9:27 PM   Specimen: Nasopharyngeal Swab  Result Value Ref Range Status   SARS Coronavirus 2 NEGATIVE NEGATIVE Final    Comment: (NOTE) SARS-CoV-2 target nucleic acids are NOT DETECTED.  The SARS-CoV-2 RNA is generally detectable in upper and lower respiratory specimens during the acute phase of infection. The lowest concentration of SARS-CoV-2 viral copies this assay can detect is 250 copies / mL. A negative result does not preclude SARS-CoV-2 infection and should not be used as the sole basis for treatment or other patient management decisions.  A negative result may occur with improper specimen collection / handling, submission of specimen other than  nasopharyngeal swab, presence of viral mutation(s) within the areas targeted by this assay, and inadequate number of viral copies (<250 copies / mL). A negative result must be combined with clinical observations, patient history, and epidemiological information.  Fact Sheet for Patients:   BoilerBrush.com.cy  Fact Sheet for Healthcare Providers: https://pope.com/  This test is not yet approved or  cleared by the [place] FDA and has been authorized for detection and/or diagnosis of SARS-CoV-2 by FDA under an Emergency Use Authorization (EUA).  This EUA will remain in effect (meaning this test can be used) for the duration of the COVID-19 declaration under Section 564(b)(1) of the Act, 21 U.S.C. section  360bbb-3(b)(1), unless the authorization is terminated or revoked sooner.  Performed at [facility], [address] [place], [place] [address]      Scheduled Meds:   stroke: mapping our early stages of recovery book   Does not apply Once   aspirin EC  325 mg Oral Daily   clopidogrel  75 mg Oral Daily   enoxaparin (LOVENOX) injection  50 mg Subcutaneous Q24H   insulin aspart  0-5 Units Subcutaneous QHS   insulin aspart  0-9 Units Subcutaneous TID WC     LOS: 1 day   Lonia Blood, MD [facility] Office  [phone] Pager - Text Page per Loretha Stapler  If 7PM-7AM, please contact night-coverage per Amion 02/03/2020, 9:24 AM

## 2020-02-03 NOTE — Evaluation (Signed)
Speech Language Pathology Evaluation Patient Details Name: Donald Malone MRN: [id number] DOB: Jul 19, 1966 Today's Date: 02/03/2020 Time: 1884-1660 SLP Time Calculation (min) (ACUTE ONLY): 40 min  Problem List:  Patient Active Problem List   Diagnosis Date Noted  . Ischemic cerebrovascular accident (CVA) (HCC) 02/02/2020  . Neurologic deficit due to acute ischemic cerebrovascular accident (CVA) (HCC) 02/01/2020  . COPD (chronic obstructive pulmonary disease) (HCC)   . Diabetes mellitus without complication (HCC)   . Hypertension   . Cerebrovascular accident (CVA) [facility])    Past Medical History:  Past Medical History:  Diagnosis Date  . COPD (chronic obstructive pulmonary disease) (HCC)   . Diabetes mellitus without complication (HCC)   . Hypertension    Past Surgical History: History reviewed. No pertinent surgical history. HPI:  54yo male admitted 02/01/20 with left side weakness. PMH: COPD, DM2, HTN, homelessness x6yrs. Was in the [facility]. MRI = 69mm acute infarct in the paramedian right pons, mild chronic small vessel ischemic changes within cerebral white matter.   Assessment / Plan / Recommendation Clinical Impression  The [employer] Mental Status (SLUMS) examination was administered. Pt scored 28/30, which indicates performance within functional limits. Points were lost on functional recall, which pt states is a baseline deficit. Cranial nerve exam is unremarkable - pt denies left facial weakness. He does report slightly slurred speech, however, he is fully intelligible. Pt was encouraged to decreased rate of speech and overarticulate to maximize clarity of speech. Receptive and Expressive Language are intact. No further ST intervention is recommended at this time. Please reconsult if needs arise.    SLP Assessment  SLP Recommendation/Assessment: Patient does not need any further Speech Language Pathology Services    Follow Up Recommendations  None       SLP  Evaluation Cognition  Overall Cognitive Status: Within Functional Limits for tasks assessed Orientation Level: Oriented X4       Comprehension  Auditory Comprehension Overall Auditory Comprehension: Appears within functional limits for tasks assessed    Expression Expression Primary Mode of Expression: Verbal Verbal Expression Overall Verbal Expression: Appears within functional limits for tasks assessed   Oral / Motor  Oral Motor/Sensory Function Overall Oral Motor/Sensory Function: Within functional limits Motor Speech Overall Motor Speech: Appears within functional limits for tasks assessed   GO                   Mitch Arquette B. Murvin Natal, [facility], CCC-SLP Speech Language Pathologist Office: [phone] Pager: [phone]  Leigh Aurora 02/03/2020, 10:22 AM

## 2020-02-04 LAB — COMPREHENSIVE METABOLIC PANEL
ALT: 115 U/L — ABNORMAL HIGH (ref 0–44)
AST: 85 U/L — ABNORMAL HIGH (ref 15–41)
Albumin: 3.1 g/dL — ABNORMAL LOW (ref 3.5–5.0)
Alkaline Phosphatase: 65 U/L (ref 38–126)
Anion gap: 10 (ref 5–15)
BUN: 9 mg/dL (ref 6–20)
CO2: 23 mmol/L (ref 22–32)
Calcium: 8.9 mg/dL (ref 8.9–10.3)
Chloride: 104 mmol/L (ref 98–111)
Creatinine, Ser: 0.96 mg/dL (ref 0.61–1.24)
GFR calc Af Amer: >60 mL/min (ref >60)
GFR calc non Af Amer: >60 mL/min (ref >60)
Glucose, Bld: 167 mg/dL — ABNORMAL HIGH (ref 70–99)
Potassium: 3.5 mmol/L (ref 3.5–5.1)
Sodium: 137 mmol/L (ref 135–145)
Total Bilirubin: 1.1 mg/dL (ref 0.3–1.2)
Total Protein: 6.3 g/dL — ABNORMAL LOW (ref 6.5–8.1)

## 2020-02-04 LAB — GLUCOSE, CAPILLARY
Glucose-Capillary: 109 mg/dL — ABNORMAL HIGH (ref 70–99)
Glucose-Capillary: 180 mg/dL — ABNORMAL HIGH (ref 70–99)
Glucose-Capillary: 157 mg/dL — ABNORMAL HIGH (ref 70–99)
Glucose-Capillary: 134 mg/dL — ABNORMAL HIGH (ref 70–99)

## 2020-02-04 NOTE — Progress Notes (Signed)
Donald Malone  WUG:[id number] DOB: Jan 04, 1966 DOA: 02/01/2020 PCP: Patient, No Pcp Per    Brief Narrative:  54 year old with a history of COPD, DM2, HTN, and homelessness who presented to the ED with left-sided facial weakness, left arm weakness, and left leg weakness of acute onset 01/30/2020.  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  In the ED CT head was negative for acute intracranial abnormality.  MRI brain suggested an 11 mm acute infarct in the right paramedian pons.  Significant Events: 7/14 admit via [place]  Antimicrobials:  None  Subjective: No new complaints today.  In good spirits.  Reports continued improvement with only mild persisting neurologic symptoms primarily focused in his left upper extremity/hand at this time.  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 Multiple periapical abscesses of maxillary dentition.  MRI  R paramedian pontine infarct. Small vessel disease. Small R maxillary sinus mucous retention cyst.  MRA  No LVO. Mid BA short moderate stenosis. L A2 near occlusion. Widespread moderate small vessel disease.   CTA head moderate mid BA stenosis. Other stenoses:  L A2 high-grade, L P2 moderate, mid R P2 mild to moderate   CTA neck Unremarkable   2D Echo EF 50-55%. No source of embolus. LA mildly dilated  LDL 108  HgbA1c 7.8  No antithrombotic prior to admission, now on aspirin 325 mg daily and clopidogrel 75 mg daily DAPT for 3 months and then ASA alone due to large vessel stenosis.    Therapy recommendations:  Outpt OT, Outpt PT  Basilar artery stenosis Stroke Team suggest medical management for now with dual  antiplatelet therapy -consider adding high-dose statin if LFTs normalize  Cocaine abuse UDS positive for cocaine -patient counseled on absolute need to fully abstain from cocaine and states he is willing  DM2 A1c 6.26 September 2019 and 7.8 this admission - not on any medications presently -CBG for the most part well controlled at present  HLD LDL 108 with goal of less than 70 -unable to initiate statin at present as LFTs are mildly elevated -viral hepatitis panel unrevealing -consider initiating high-dose statin if LFTs normalize  HTN Not on any medications presently -practice permissive hypertension for now but will need to initiate medical therapy in 3 to 5 days if blood pressure remains elevated  Homeless Patient also does not have a PCP or access to medications -TOC consulted  Dental caries Multiple periapical abscesses of maxillary dentition noted on CT imaging - will question pt as to sx or not   DVT prophylaxis: Lovenox Code Status: FULL CODE Family Communication:  Status is: Inpatient  Remains inpatient appropriate because:Unsafe d/c plan   Dispo: The patient is from: Home              Anticipated d/c is to: unclear              Anticipated d/c date is: 1 day              Patient currently is not medically stable to d/c.  Consultants:  Stroke Team  Objective: Blood pressure (!) 160/81, pulse 68, temperature 98.3 F (36.8 C), temperature source Oral,  resp. rate 20, height 5\' 2"  (1.575 m), weight 98.9 kg, SpO2 100 %.  Intake/Output Summary (Last 24 hours) at 02/04/2020 0931 Last data filed at 02/03/2020 1338 Gross per 24 hour  Intake --  Output 400 ml  Net -400 ml   Filed Weights   02/03/20 1700  Weight: 98.9 kg    Examination: General: NAD Lungs: CTA B  Cardiovascular: Regular rate and rhythm  Abdomen: NT/ND, soft, bs+, no mass  Extremities: No signif edema bilateral lower extremities  CBC: Recent Labs  Lab 02/01/20 1043 02/02/20 0423  WBC 4.7 3.9*   HGB 14.1 13.3  HCT 42.7 41.3  MCV 87.5 88.6  PLT 156 145*   Basic Metabolic Panel: Recent Labs  Lab 02/01/20 1043 02/02/20 0423 02/04/20 0748  NA 141 141 137  K 4.0 3.5 3.5  CL 107 105 104  CO2 23 26 23   GLUCOSE 209* 185* 167*  BUN 14 13 9   CREATININE 1.19 1.08 0.96  CALCIUM 8.9 8.9 8.9   GFR: Estimated Creatinine Clearance: 92 mL/min (by C-G formula based on SCr of 0.96 mg/dL).  Liver Function Tests: Recent Labs  Lab 02/02/20 0423 02/04/20 0748  AST 141* 85*  ALT 135* 115*  ALKPHOS 68 65  BILITOT 0.4 1.1  PROT 6.3* 6.3*  ALBUMIN 3.2* 3.1*    Cardiac Enzymes: Recent Labs  Lab 02/02/20 0423  CKTOTAL 189    HbA1C: Hgb A1c MFr Bld  Date/Time Value Ref Range Status  02/02/2020 04:23 AM 7.8 (H) 4.8 - 5.6 % Final    Comment:    (NOTE) Pre diabetes:          5.7%-6.4%  Diabetes:              >6.4%  Glycemic control for   <7.0% adults with diabetes   09/26/2019 08:51 AM 6.8 (H) 4.8 - 5.6 % Final    Comment:    (NOTE) Pre diabetes:          5.7%-6.4% Diabetes:              >6.4% Glycemic control for   <7.0% adults with diabetes     CBG: Recent Labs  Lab 02/03/20 0640 02/03/20 1335 02/03/20 1717 02/03/20 2120 02/04/20 0605  GLUCAP 147* 107* 190* 134* 109*    Recent Results (from the past 240 hour(s))  SARS Coronavirus 2 by RT PCR (hospital order, performed in [facility] hospital lab) Nasopharyngeal Nasopharyngeal Swab     Status: None   Collection Time: 02/01/20  9:27 PM   Specimen: Nasopharyngeal Swab  Result Value Ref Range Status   SARS Coronavirus 2 NEGATIVE NEGATIVE Final    Comment: (NOTE) SARS-CoV-2 target nucleic acids are NOT DETECTED.  The SARS-CoV-2 RNA is generally detectable in upper and lower respiratory specimens during the acute phase of infection. The lowest concentration of SARS-CoV-2 viral copies this assay can detect is 250 copies / mL. A negative result does not preclude SARS-CoV-2 infection and should not be used  as the sole basis for treatment or other patient management decisions.  A negative result may occur with improper specimen collection / handling, submission of specimen other than nasopharyngeal swab, presence of viral mutation(s) within the areas targeted by this assay, and inadequate number of viral copies (<250 copies / mL). A negative result must be combined with clinical observations, patient history, and epidemiological information.  Fact Sheet for Patients:   02/06/20  Fact Sheet for Healthcare Providers: [facility]  This test is not yet  approved or  cleared by the [place] and has been authorized for detection and/or diagnosis of SARS-CoV-2 by FDA under an Emergency Use Authorization (EUA).  This EUA will remain in effect (meaning this test can be used) for the duration of the COVID-19 declaration under Section 564(b)(1) of the Act, 21 U.S.C. section 360bbb-3(b)(1), unless the authorization is terminated or revoked sooner.  Performed at [facility], [address] [place], [place] [address]      Scheduled Meds: .  stroke: mapping our early stages of recovery book   Does not apply Once  . aspirin EC  325 mg Oral Daily  . clopidogrel  75 mg Oral Daily  . enoxaparin (LOVENOX) injection  50 mg Subcutaneous Q24H  . insulin aspart  0-5 Units Subcutaneous QHS  . insulin aspart  0-9 Units Subcutaneous TID WC     LOS: 2 days   Lonia Blood, MD [facility] Office  [phone] Pager - Text Page per Amion  If 7PM-7AM, please contact night-coverage per Amion 02/04/2020, 9:31 AM

## 2020-02-04 NOTE — Progress Notes (Signed)
Physical Therapy Treatment Patient Details Name: Donald Malone MRN: [id number] DOB: 06/15/1966 Today's Date: 02/04/2020    History of Present Illness 54 y.o male presenting with L sided weakness. 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 PMH includes COPD, type 2 diabetes mellitus, hypertension, and homelessness    PT Comments    Pt in bed upon arrival of PT, agreeable to session with focus on progressing functional stability and independence. The pt was able to demo good stability with progression of hallway ambulation to include changes in gait speed, quick turns and stops, and stepping over objects on the floor. He continues to demo functional weakness in LLE that increases with continued activity, and is limited in both static and dynamic SLS on his LLE. The pt was then further challenged by functional strengthening of LLE, and will continue to benefit from skilled PT to progress functional strength and stability as well as reactive stability prior to d/c.     Follow Up Recommendations  Outpatient PT     Equipment Recommendations  None recommended by PT    Recommendations for Other Services       Precautions / Restrictions Precautions Precautions: Fall Restrictions Weight Bearing Restrictions: No    Mobility  Bed Mobility Overal bed mobility: Modified Independent Bed Mobility: Supine to Sit     Supine to sit: Modified independent (Device/Increase time);HOB elevated        Transfers Overall transfer level: Needs assistance Equipment used: None Transfers: Sit to/from Stand Sit to Stand: Supervision         General transfer comment: Supervision for safety. Progressing to mod I. Performed 5xSTS from EOB, then 2x5 from chair with RLE extended to increase work through LLE.  Ambulation/Gait Ambulation/Gait assistance: Min guard Gait Distance (Feet): 200 Feet Assistive device: None Gait Pattern/deviations: Step-through  pattern;Decreased stride length;Decreased stance time - left Gait velocity: 1.0 m/s normal speed. 1.3 m/s when asked to speed up Gait velocity interpretation: 1.31 - 2.62 ft/sec, indicative of limited community ambulator General Gait Details: Mildly unsteady gait with left knee instability, "feels spongy."  Some incoordination noted with LLE as well. No knee buckling noted. pt able to complete changes in speed in x4 intervals through hall without LOB      Modified Rankin (Stroke Patients Only) Modified Rankin (Stroke Patients Only) Pre-Morbid Rankin Score: No symptoms Modified Rankin: Moderate disability     Balance Overall balance assessment: Needs assistance Sitting-balance support: Feet supported;No upper extremity supported Sitting balance-Leahy Scale: Good Sitting balance - Comments: Able to adjust socks without difficulty.   Standing balance support: During functional activity Standing balance-Leahy Scale: Good Standing balance comment: pt able to ambulate without AD with some challenge without LOB             High level balance activites: Head turns;Sudden stops;Turns High Level Balance Comments: Pt able to demo good changes in gait speed, quick turns, stepping over objects on floor all without LOB.            Cognition Arousal/Alertness: Awake/alert Behavior During Therapy: WFL for tasks assessed/performed Overall Cognitive Status: Within Functional Limits for tasks assessed                                 General Comments: pt generally [facility] today      Exercises      General Comments General comments (skin integrity, edema, etc.): 5xsts performed in 10  seconds without use of UE. (cut off score of 12 sec)      Pertinent Vitals/Pain Pain Assessment: No/denies pain           PT Goals (current goals can now be found in the care plan section) Acute Rehab PT Goals Patient Stated Goal: return to baseline PT Goal Formulation: With  patient Time For Goal Achievement: 02/16/20 Potential to Achieve Goals: Good Progress towards PT goals: Progressing toward goals    Frequency    Min 3X/week      PT Plan Current plan remains appropriate;Frequency needs to be updated       AM-PAC PT "6 Clicks" Mobility   Outcome Measure  Help needed turning from your back to your side while in a flat bed without using bedrails?: None Help needed moving from lying on your back to sitting on the side of a flat bed without using bedrails?: None Help needed moving to and from a bed to a chair (including a wheelchair)?: None Help needed standing up from a chair using your arms (e.g., wheelchair or bedside chair)?: None Help needed to walk in hospital room?: A Little Help needed climbing 3-5 steps with a railing? : A Little 6 Click Score: 22    End of Session Equipment Utilized During Treatment: Gait belt Activity Tolerance: Patient tolerated treatment well Patient left: in bed;with call bell/phone within reach (sitting EOB) Nurse Communication: Mobility status PT Visit Diagnosis: Unsteadiness on feet (R26.81)     Time: 4696-2952 PT Time Calculation (min) (ACUTE ONLY): 25 min  Charges:  $Gait Training: 23-37 mins                     Rolm Baptise, PT, DPT   Acute Rehabilitation Department Pager #: [phone]   Gaetana Michaelis 02/04/2020, 10:53 AM

## 2020-02-04 NOTE — Progress Notes (Signed)
Occupational Therapy Treatment Patient Details Name: Donald Malone MRN: [id number] DOB: 07-Jan-1966 Today's Date: 02/04/2020    History of present illness 54 y.o male presenting with L sided weakness. 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 PMH includes COPD, type 2 diabetes mellitus, hypertension, and homelessness   OT comments  Pt seated EOB upon arrival, pleasant and agreeable to OT session. Focus of session on LUE HEP. Issued theraputty HEP and fine motor/coordination HEP with pt practicing/return demonstrating exercises from both with min cues throughout. Pt continues to have weakness in L hand which he is very motivated to improve upon. Educated in additional activities and methods for working on his L hand coordination while in the room including opening/closing containers of varying shapes, etc. Feel POC remains appropriate at this time. Will continue to follow while acutely admitted.   Follow Up Recommendations  Outpatient OT    Equipment Recommendations  None recommended by OT    Recommendations for Other Services Other (comment) (social work for housing placement )    Precautions / Restrictions Precautions Precautions: Fall       Mobility Bed Mobility               General bed mobility comments: pt seated EOB upon arrival, left EOB end of session   Transfers Overall transfer level: Needs assistance Equipment used: None Transfers: Sit to/from Stand Sit to Stand: Supervision         General transfer comment: for safety    Balance Overall balance assessment: Needs assistance Sitting-balance support: Feet supported;No upper extremity supported Sitting balance-Leahy Scale: Good                                     ADL either performed or assessed with clinical judgement   ADL Overall ADL's : Needs assistance/impaired                                       General ADL Comments: focus  of session on LUE HEP                       Cognition Arousal/Alertness: Awake/alert Behavior During Therapy: WFL for tasks assessed/performed Overall Cognitive Status: Within Functional Limits for tasks assessed                                 General Comments: pt generally WFL today        Exercises Exercises: Other exercises Other Exercises Other Exercises: issued level 2 putty and HEP, reivewed with pt return demonstrating using LUE Other Exercises: issued fine motor/coordination HEP with pt return demosntrating    Shoulder Instructions       General Comments      Pertinent Vitals/ Pain       Pain Assessment: No/denies pain  Home Living                                          Prior Functioning/Environment              Frequency  Min 2X/week        Progress Toward Goals  OT  Goals(current goals can now be found in the care plan section)  Progress towards OT goals: Progressing toward goals  Acute Rehab OT Goals Patient Stated Goal: return to baseline OT Goal Formulation: With patient Time For Goal Achievement: 02/16/20 Potential to Achieve Goals: Good ADL Goals Pt/caregiver will Perform Home Exercise Program: Increased strength;Left upper extremity;With theraputty;With theraband;With written HEP provided Additional ADL Goal #1: Pt will recall and apply 3-5 fall prevention strategies to typical environment beyond d/c  Plan Discharge plan remains appropriate    Co-evaluation                 AM-PAC OT "6 Clicks" Daily Activity     Outcome Measure   Help from another person eating meals?: None Help from another person taking care of personal grooming?: None Help from another person toileting, which includes using toliet, bedpan, or urinal?: A Little Help from another person bathing (including washing, rinsing, drying)?: A Little Help from another person to put on and taking off regular upper body  clothing?: None Help from another person to put on and taking off regular lower body clothing?: A Little 6 Click Score: 21    End of Session    OT Visit Diagnosis: Other abnormalities of gait and mobility (R26.89);Hemiplegia and hemiparesis;Muscle weakness (generalized) (M62.81);History of falling (Z91.81) Hemiplegia - Right/Left: Left Hemiplegia - dominant/non-dominant: Non-Dominant Hemiplegia - caused by: Cerebral infarction   Activity Tolerance Patient tolerated treatment well   Patient Left in bed;with call bell/phone within reach   Nurse Communication Mobility status        Time: 1639-1700 OT Time Calculation (min): 21 min  Charges: OT General Charges $OT Visit: 1 Visit OT Treatments $Therapeutic Activity: 8-22 mins  Donald Malone, OT Acute Rehabilitation Services Pager [phone] Office [phone]    Donald Malone 02/04/2020, 5:58 PM

## 2020-02-05 LAB — GLUCOSE, CAPILLARY
Glucose-Capillary: 133 mg/dL — ABNORMAL HIGH (ref 70–99)
Glucose-Capillary: 134 mg/dL — ABNORMAL HIGH (ref 70–99)
Glucose-Capillary: 158 mg/dL — ABNORMAL HIGH (ref 70–99)
Glucose-Capillary: 132 mg/dL — ABNORMAL HIGH (ref 70–99)

## 2020-02-05 MED ORDER — ENALAPRIL MALEATE 5 MG PO TABS
5.0000 mg | ORAL_TABLET | Freq: Every day | ORAL | Status: DC
  Administered 2020-02-05 – 2020-02-08 (×4): 5 mg via ORAL
  Filled 2020-02-05 (×4): qty 1

## 2020-02-05 MED ORDER — METFORMIN HCL 500 MG PO TABS
500.0000 mg | ORAL_TABLET | Freq: Two times a day (BID) | ORAL | Status: DC
  Administered 2020-02-05 – 2020-02-08 (×7): 500 mg via ORAL
  Filled 2020-02-05 (×7): qty 1

## 2020-02-05 MED ORDER — HYDROCHLOROTHIAZIDE 12.5 MG PO CAPS
12.5000 mg | ORAL_CAPSULE | Freq: Every day | ORAL | Status: DC
  Administered 2020-02-05 – 2020-02-08 (×4): 12.5 mg via ORAL
  Filled 2020-02-05 (×4): qty 1

## 2020-02-05 NOTE — Progress Notes (Addendum)
Donald Malone  BDZ:[id number] DOB: August 22, 1965 DOA: 02/01/2020 PCP: Patient, No Pcp Per    Brief Narrative:  54 year old with a history of COPD, DM2, HTN, and homelessness who presented to the ED with left-sided facial weakness, left arm weakness, and left leg weakness of acute onset 01/30/2020. 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  In the ED CT head was negative for acute intracranial abnormality.  MRI brain noted an 11 mm acute infarct in the right paramedian pons.  Significant Events: 7/14 admit via   Antimicrobials:  None  Subjective: In good spirits.  Reports ongoing improvement in left arm weakness and feels the left leg has completely recovered.  Denies any persisting facial weakness.  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 Multiple periapical abscesses of maxillary dentition.  MRI  R paramedian pontine infarct. Small vessel disease. Small R maxillary sinus mucous retention cyst.  MRA  No LVO. Mid BA short moderate stenosis. L A2 near occlusion. Widespread moderate small vessel disease.   CTA head moderate mid BA stenosis. Other stenoses:  L A2 high-grade, L P2 moderate, mid R P2 mild to moderate   CTA neck Unremarkable   2D Echo EF 50-55%. No source of embolus. LA mildly dilated  LDL 108  HgbA1c 7.8  No antithrombotic prior to admission, now on aspirin 325 mg daily and clopidogrel 75 mg daily DAPT for 3 months and then ASA alone due to large vessel stenosis.    Therapy recommendations:  Outpt OT, Outpt PT  Basilar artery stenosis Stroke Team suggests medical management for now with dual antiplatelet therapy  -consider adding high-dose statin if LFTs normalize  Cocaine abuse UDS positive for cocaine -patient counseled on absolute need to fully abstain from cocaine and states he is willing  DM2 A1c 6.26 September 2019 and 7.8 this admission - not on any medications presently - CBG for the most part well controlled at present  HLD LDL 108 with goal of less than 70 -unable to initiate statin at present as LFTs are mildly elevated -viral hepatitis panel unrevealing -consider initiating high-dose statin if LFTs normalize  HTN Not on any medications presently -will now resume medical therapy utilizing low-dose diuretic and ACE inhibitor given his diabetes  Homeless Patient also does not have a PCP or access to medications -TOC consulted  Dental caries Multiple periapical abscesses of maxillary dentition noted on CT imaging - will question pt as to sx or not   DVT prophylaxis: Lovenox Code Status: FULL CODE Family Communication:  Status is: Inpatient  Remains inpatient appropriate because:Unsafe d/c plan   Dispo: The patient is from: Home              Anticipated d/c is to: unclear              Anticipated d/c date is: 1 day              Patient currently is not medically stable to d/c.  Consultants:  Stroke Team  Objective: Blood pressure (!) 152/90, pulse 61, temperature 98.4 F (36.9 C), temperature source Axillary, resp. rate 20, height 5\' 2"  (1.575 m), weight  98.9 kg, SpO2 100 %.  Intake/Output Summary (Last 24 hours) at 02/05/2020 0914 Last data filed at 02/04/2020 1900 Gross per 24 hour  Intake --  Output 1 ml  Net -1 ml   Filed Weights   02/03/20 1700  Weight: 98.9 kg    Examination: General: NAD Lungs: CTA B  Cardiovascular: RRR Abdomen: NT/ND, soft, bs+, no mass  Extremities: No edema bilateral lower extremities  CBC: Recent Labs  Lab 02/01/20 1043 02/02/20 0423  WBC 4.7 3.9*  HGB 14.1 13.3  HCT 42.7 41.3  MCV 87.5 88.6  PLT 156 145*   Basic Metabolic  Panel: Recent Labs  Lab 02/01/20 1043 02/02/20 0423 02/04/20 0748  NA 141 141 137  K 4.0 3.5 3.5  CL 107 105 104  CO2 23 26 23   GLUCOSE 209* 185* 167*  BUN 14 13 9   CREATININE 1.19 1.08 0.96  CALCIUM 8.9 8.9 8.9   GFR: Estimated Creatinine Clearance: 92 mL/min (by C-G formula based on SCr of 0.96 mg/dL).  Liver Function Tests: Recent Labs  Lab 02/02/20 0423 02/04/20 0748  AST 141* 85*  ALT 135* 115*  ALKPHOS 68 65  BILITOT 0.4 1.1  PROT 6.3* 6.3*  ALBUMIN 3.2* 3.1*    Cardiac Enzymes: Recent Labs  Lab 02/02/20 0423  CKTOTAL 189    HbA1C: Hgb A1c MFr Bld  Date/Time Value Ref Range Status  02/02/2020 04:23 AM 7.8 (H) 4.8 - 5.6 % Final    Comment:    (NOTE) Pre diabetes:          5.7%-6.4%  Diabetes:              >6.4%  Glycemic control for   <7.0% adults with diabetes   09/26/2019 08:51 AM 6.8 (H) 4.8 - 5.6 % Final    Comment:    (NOTE) Pre diabetes:          5.7%-6.4% Diabetes:              >6.4% Glycemic control for   <7.0% adults with diabetes     CBG: Recent Labs  Lab 02/04/20 0605 02/04/20 1133 02/04/20 1608 02/04/20 2148 02/05/20 0652  GLUCAP 109* 180* 157* 134* 133*    Recent Results (from the past 240 hour(s))  SARS Coronavirus 2 by RT PCR (hospital order, performed in [facility] hospital lab) Nasopharyngeal Nasopharyngeal Swab     Status: None   Collection Time: 02/01/20  9:27 PM   Specimen: Nasopharyngeal Swab  Result Value Ref Range Status   SARS Coronavirus 2 NEGATIVE NEGATIVE Final    Comment: (NOTE) SARS-CoV-2 target nucleic acids are NOT DETECTED.  The SARS-CoV-2 RNA is generally detectable in upper and lower respiratory specimens during the acute phase of infection. The lowest concentration of SARS-CoV-2 viral copies this assay can detect is 250 copies / mL. A negative result does not preclude SARS-CoV-2 infection and should not be used as the sole basis for treatment or other patient management decisions.  A  negative result may occur with improper specimen collection / handling, submission of specimen other than nasopharyngeal swab, presence of viral mutation(s) within the areas targeted by this assay, and inadequate number of viral copies (<250 copies / mL). A negative result must be combined with clinical observations, patient history, and epidemiological information.  Fact Sheet for Patients:   [facility]  Fact Sheet for Healthcare Providers: 02/03/20  This test is not yet approved or  cleared by the [employer] FDA and has been authorized for  detection and/or diagnosis of SARS-CoV-2 by FDA under an Emergency Use Authorization (EUA).  This EUA will remain in effect (meaning this test can be used) for the duration of the COVID-19 declaration under Section 564(b)(1) of the Act, 21 U.S.C. section 360bbb-3(b)(1), unless the authorization is terminated or revoked sooner.  Performed at [facility], [address], [place], [place] [address]      Scheduled Meds: . aspirin EC  325 mg Oral Daily  . clopidogrel  75 mg Oral Daily  . enoxaparin (LOVENOX) injection  50 mg Subcutaneous Q24H  . insulin aspart  0-5 Units Subcutaneous QHS  . insulin aspart  0-9 Units Subcutaneous TID WC     LOS: 3 days   Lonia Blood, MD [facility] Office  [phone] Pager - Text Page per Amion  If 7PM-7AM, please contact night-coverage per Amion 02/05/2020, 9:14 AM

## 2020-02-06 LAB — GLUCOSE, CAPILLARY
Glucose-Capillary: 113 mg/dL — ABNORMAL HIGH (ref 70–99)
Glucose-Capillary: 99 mg/dL (ref 70–99)
Glucose-Capillary: 158 mg/dL — ABNORMAL HIGH (ref 70–99)
Glucose-Capillary: 153 mg/dL — ABNORMAL HIGH (ref 70–99)

## 2020-02-06 NOTE — Progress Notes (Signed)
Occupational Therapy Treatment Patient Details Name: Donald Malone MRN: [id number] DOB: Apr 06, 1966 Today's Date: 02/06/2020    History of present illness 54 y.o male presenting with L sided weakness. 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 PMH includes COPD, type 2 diabetes mellitus, hypertension, and homelessness   OT comments  Upon arrival, pt eager and agreeable to skilled OT therapy session. Pt and I discussed HEP given last session - good recall of exercises and told therapist he has been walking and completing HEPs throughout the day. Administered clothes pins and deck of cards to pt and he was eager and problem solving ideas for hand exercises. Worked on thumb slides (sliding one card at a time out of deck), picking up cards and flipping over into a pile, and sorting cards into categories. Pt used clothes pins to put on cups and pinch putty. Pt and I finished session with isolated resistance exercises with putty. Demonstrated good understanding of exercises through teach back. Pt was left in bed with call bell and phone within reach and nurse in room. Will continue to follow acutely.   Follow Up Recommendations  Outpatient OT    Equipment Recommendations  None recommended by OT    Recommendations for Other Services Other (comment) (social work for housing placement)    Precautions / Restrictions Precautions Precautions: Fall       Mobility Bed Mobility Overal bed mobility: Modified Independent Bed Mobility: Supine to Sit     Supine to sit: Modified independent (Device/Increase time);HOB elevated     General bed mobility comments: Pt sitting at EOB with increased effort and HOB elevated  Transfers                      Balance Overall balance assessment: Needs assistance Sitting-balance support: Feet supported;No upper extremity supported Sitting balance-Leahy Scale: Good Sitting balance - Comments: able to complete  table top activity with needing some support from table                                   ADL either performed or assessed with clinical judgement   ADL   Eating/Feeding: Independent   Grooming: Independent                                                 Cognition Arousal/Alertness: Awake/alert Behavior During Therapy: WFL for tasks assessed/performed Overall Cognitive Status: Within Functional Limits for tasks assessed                                 General Comments: Pt able to recall putty and general fine motor exercises        Exercises Other Exercises Other Exercises: issued clothes pins and was able to complete tip pinch to put clothes pins on putty cup Other Exercises: issued deck of cards to complete thumb slides and picking up one card at a time and flipping over into pile.  Other Exercises: isolated putty finger resistance exercises           Pertinent Vitals/ Pain       Pain Assessment: No/denies pain         Frequency  Min 2X/week  Progress Toward Goals  OT Goals(current goals can now be found in the care plan section)  Progress towards OT goals: Progressing toward goals  Acute Rehab OT Goals Patient Stated Goal: return to baseline OT Goal Formulation: With patient Time For Goal Achievement: 02/16/20 Potential to Achieve Goals: Good  Plan Discharge plan remains appropriate       AM-PAC OT "6 Clicks" Daily Activity     Outcome Measure   Help from another person eating meals?: None Help from another person taking care of personal grooming?: None Help from another person toileting, which includes using toliet, bedpan, or urinal?: A Little Help from another person bathing (including washing, rinsing, drying)?: A Little Help from another person to put on and taking off regular upper body clothing?: None Help from another person to put on and taking off regular lower body clothing?: A  Little 6 Click Score: 21    End of Session    OT Visit Diagnosis: Other abnormalities of gait and mobility (R26.89);Hemiplegia and hemiparesis;Muscle weakness (generalized) (M62.81);History of falling (Z91.81) Hemiplegia - Right/Left: Left Hemiplegia - dominant/non-dominant: Non-Dominant Hemiplegia - caused by: Cerebral infarction   Activity Tolerance Patient tolerated treatment well   Patient Left in bed;with call bell/phone within reach;with nursing/sitter in room   Nurse Communication Mobility status        Time: 2924-4628 OT Time Calculation (min): 17 min  Charges: OT Treatments $Neuromuscular Re-education: 8-22 mins  Paetyn Pietrzak/OTS   Avia Merkley 02/06/2020, 3:42 PM

## 2020-02-06 NOTE — TOC Progression Note (Signed)
Transition of Care [facility]) - Progression Note    Patient Details  Name: Donald Malone MRN: [id number] Date of Birth: 18-May-1966  Transition of Care [facility]) CM/SW Contact  Beckie Busing, RN Phone Number: [phone]  02/06/2020, 12:35 PM  Clinical Narrative:    Match form active in the system til 7/28. CM will follow for co pay amounts.  Expected Discharge Plan: Homeless Shelter Barriers to Discharge: Continued Medical Work up, Inadequate or no insurance  Expected Discharge Plan and Services Expected Discharge Plan: Homeless Shelter In-house Referral: Clinical Social Work Discharge Planning Services: CM Consult   Living arrangements for the past 2 months: Homeless                                       Social Determinants of Health (SDOH) Interventions    Readmission Risk Interventions No flowsheet data found.

## 2020-02-06 NOTE — Progress Notes (Signed)
Donald Malone  WLN:[id number] DOB: Sep 01, 1965 DOA: 02/01/2020 PCP: Patient, No Pcp Per    Brief Narrative:  54 year old with a history of COPD, DM2, HTN, and homelessness who presented to the ED with left-sided facial weakness, left arm weakness, and left leg weakness of acute onset 01/30/2020. 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  In the ED CT head was negative for acute intracranial abnormality.  MRI brain noted an 11 mm acute infarct in the right paramedian pons.  Significant Events: 7/14 admit via [place]  Antimicrobials:  None  Subjective: No new complaints today.  Ambulating in hallway freely.  Specifically denies any dental pain whatsoever.  No foul taste.  No difficulty chewing.  No fevers or chills.  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 Multiple periapical abscesses of maxillary dentition.  MRI  R paramedian pontine infarct. Small vessel disease. Small R maxillary sinus mucous retention cyst.  MRA  No LVO. Mid BA short moderate stenosis. L A2 near occlusion. Widespread moderate small vessel disease.   CTA head moderate mid BA stenosis. Other stenoses:  L A2 high-grade, L P2 moderate, mid R P2 mild to moderate   CTA neck Unremarkable   2D Echo EF 50-55%. No source of embolus. LA mildly dilated  LDL 108  HgbA1c 7.8  No antithrombotic prior to admission, now on aspirin 325 mg daily and clopidogrel 75 mg daily DAPT for 3 months and then ASA alone due to large vessel stenosis.    Therapy recommendations:  Outpt OT, Outpt PT  Basilar artery stenosis Stroke Team suggests medical management for now with dual  antiplatelet therapy -consider adding high-dose statin if LFTs normalize  Cocaine abuse UDS positive for cocaine -patient counseled on absolute need to fully abstain from cocaine and states he is willing  DM2 A1c 6.26 September 2019 and 7.8 this admission - not on any medications at presentation - CBG for the most part well controlled at present - glucophage initiated this hospital stay   HLD LDL 108 with goal of less than 70 -unable to initiate statin at present as LFTs are mildly elevated -viral hepatitis panel unrevealing -consider initiating high-dose statin if LFTs normalize  HTN Not on any medications at admission - have initiated medical therapy utilizing low-dose diuretic and ACE inhibitor given his diabetes  Homeless Patient also does not have a PCP or access to medications -TOC consulted  Dental caries Multiple periapical abscesses of maxillary dentition noted on CT imaging -and the patient is fully asymptomatic I have chosen not to dose with antibiotics for now  DVT prophylaxis: Lovenox Code Status: FULL CODE Family Communication:  Status is: Inpatient  Remains inpatient appropriate because:Unsafe d/c plan   Dispo: The patient is from: Home              Anticipated d/c is to: unclear              Anticipated d/c date is: 1 day              Patient currently is not medically stable to d/c.  Consultants:  Stroke Team  Objective: Blood pressure (!) 168/93,  pulse (!) 56, temperature 98.3 F (36.8 C), temperature source Oral, resp. rate 20, height 5\' 2"  (1.575 m), weight 98.9 kg, SpO2 100 %.  Intake/Output Summary (Last 24 hours) at 02/06/2020 1504 Last data filed at 02/06/2020 0820 Gross per 24 hour  Intake 240 ml  Output --  Net 240 ml   Filed Weights   02/03/20 1700  Weight: 98.9 kg    Examination: General: NAD Lungs: CTA B  Cardiovascular: RRR Abdomen: NT/ND, soft, bs+, no mass  Extremities: No edema B LE   CBC: Recent Labs  Lab 02/01/20 1043  02/02/20 0423  WBC 4.7 3.9*  HGB 14.1 13.3  HCT 42.7 41.3  MCV 87.5 88.6  PLT 156 145*   Basic Metabolic Panel: Recent Labs  Lab 02/01/20 1043 02/02/20 0423 02/04/20 0748  NA 141 141 137  K 4.0 3.5 3.5  CL 107 105 104  CO2 23 26 23   GLUCOSE 209* 185* 167*  BUN 14 13 9   CREATININE 1.19 1.08 0.96  CALCIUM 8.9 8.9 8.9   GFR: Estimated Creatinine Clearance: 92 mL/min (by C-G formula based on SCr of 0.96 mg/dL).  Liver Function Tests: Recent Labs  Lab 02/02/20 0423 02/04/20 0748  AST 141* 85*  ALT 135* 115*  ALKPHOS 68 65  BILITOT 0.4 1.1  PROT 6.3* 6.3*  ALBUMIN 3.2* 3.1*    Cardiac Enzymes: Recent Labs  Lab 02/02/20 0423  CKTOTAL 189    HbA1C: Hgb A1c MFr Bld  Date/Time Value Ref Range Status  02/02/2020 04:23 AM 7.8 (H) 4.8 - 5.6 % Final    Comment:    (NOTE) Pre diabetes:          5.7%-6.4%  Diabetes:              >6.4%  Glycemic control for   <7.0% adults with diabetes   09/26/2019 08:51 AM 6.8 (H) 4.8 - 5.6 % Final    Comment:    (NOTE) Pre diabetes:          5.7%-6.4% Diabetes:              >6.4% Glycemic control for   <7.0% adults with diabetes     CBG: Recent Labs  Lab 02/05/20 1130 02/05/20 1611 02/05/20 2127 02/06/20 0619 02/06/20 1158  GLUCAP 134* 158* 132* 113* 99    Recent Results (from the past 240 hour(s))  SARS Coronavirus 2 by RT PCR (hospital order, performed in [facility] hospital lab) Nasopharyngeal Nasopharyngeal Swab     Status: None   Collection Time: 02/01/20  9:27 PM   Specimen: Nasopharyngeal Swab  Result Value Ref Range Status   SARS Coronavirus 2 NEGATIVE NEGATIVE Final    Comment: (NOTE) SARS-CoV-2 target nucleic acids are NOT DETECTED.  The SARS-CoV-2 RNA is generally detectable in upper and lower respiratory specimens during the acute phase of infection. The lowest concentration of SARS-CoV-2 viral copies this assay can detect is 250 copies / mL. A negative result does not preclude SARS-CoV-2  infection and should not be used as the sole basis for treatment or other patient management decisions.  A negative result may occur with improper specimen collection / handling, submission of specimen other than nasopharyngeal swab, presence of viral mutation(s) within the areas targeted by this assay, and inadequate number of viral copies (<250 copies / mL). A negative result must be combined with clinical observations, patient history, and epidemiological information.  Fact Sheet for Patients:   02/08/20  Fact Sheet for Healthcare Providers: [facility]  This test is not yet approved or  cleared by the [place] and has been authorized for detection and/or diagnosis of SARS-CoV-2 by FDA under an Emergency Use Authorization (EUA).  This EUA will remain in effect (meaning this test can be used) for the duration of the COVID-19 declaration under Section 564(b)(1) of the Act, 21 U.S.C. section 360bbb-3(b)(1), unless the authorization is terminated or revoked sooner.  Performed at [facility], [address], [place], [place] [address]      Scheduled Meds: . aspirin EC  325 mg Oral Daily  . clopidogrel  75 mg Oral Daily  . enalapril  5 mg Oral Daily  . enoxaparin (LOVENOX) injection  50 mg Subcutaneous Q24H  . hydrochlorothiazide  12.5 mg Oral Daily  . insulin aspart  0-5 Units Subcutaneous QHS  . insulin aspart  0-9 Units Subcutaneous TID WC  . metFORMIN  500 mg Oral BID WC     LOS: 4 days   Lonia Blood, MD [facility] Office  [phone] Pager - Text Page per Loretha Stapler  If 7PM-7AM, please contact night-coverage per Amion 02/06/2020, 3:04 PM

## 2020-02-07 LAB — COMPREHENSIVE METABOLIC PANEL
ALT: 112 U/L — ABNORMAL HIGH (ref 0–44)
AST: 67 U/L — ABNORMAL HIGH (ref 15–41)
Albumin: 3.2 g/dL — ABNORMAL LOW (ref 3.5–5.0)
Alkaline Phosphatase: 61 U/L (ref 38–126)
Anion gap: 7 (ref 5–15)
BUN: 17 mg/dL (ref 6–20)
CO2: 25 mmol/L (ref 22–32)
Calcium: 9.3 mg/dL (ref 8.9–10.3)
Chloride: 107 mmol/L (ref 98–111)
Creatinine, Ser: 1.17 mg/dL (ref 0.61–1.24)
GFR calc Af Amer: >60 mL/min (ref >60)
GFR calc non Af Amer: >60 mL/min (ref >60)
Glucose, Bld: 124 mg/dL — ABNORMAL HIGH (ref 70–99)
Potassium: 3.5 mmol/L (ref 3.5–5.1)
Sodium: 139 mmol/L (ref 135–145)
Total Bilirubin: 0.8 mg/dL (ref 0.3–1.2)
Total Protein: 6.4 g/dL — ABNORMAL LOW (ref 6.5–8.1)

## 2020-02-07 LAB — GLUCOSE, CAPILLARY
Glucose-Capillary: 121 mg/dL — ABNORMAL HIGH (ref 70–99)
Glucose-Capillary: 195 mg/dL — ABNORMAL HIGH (ref 70–99)
Glucose-Capillary: 110 mg/dL — ABNORMAL HIGH (ref 70–99)
Glucose-Capillary: 132 mg/dL — ABNORMAL HIGH (ref 70–99)
Glucose-Capillary: 152 mg/dL — ABNORMAL HIGH (ref 70–99)
Glucose-Capillary: 120 mg/dL — ABNORMAL HIGH (ref 70–99)

## 2020-02-07 NOTE — Progress Notes (Signed)
Physical Therapy Treatment Patient Details Name: Donald Malone MRN: [id number] DOB: Jun 25, 1966 Today's Date: 02/07/2020    History of Present Illness 54 y.o male presenting with L sided weakness. 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 PMH includes COPD, type 2 diabetes mellitus, hypertension, and homelessness    PT Comments    Pt eager to show his improvements.  Emphasis on challenging balance during gait, improving gait pattern and stamina.  Pt did experience muscle fatigue and we discussed not succumbing, keeping up normalized movement and not allowing the sloppier movements.  Went over some stroke education on risk factors.    Follow Up Recommendations  Outpatient PT     Equipment Recommendations  None recommended by PT    Recommendations for Other Services       Precautions / Restrictions Precautions Precautions: Fall    Mobility  Bed Mobility Overal bed mobility: Modified Independent                Transfers Overall transfer level: Modified independent Equipment used: None                Ambulation/Gait Ambulation/Gait assistance: Independent (in a home like environment) Gait Distance (Feet): 500 Feet Assistive device: None Gait Pattern/deviations: Step-through pattern   Gait velocity interpretation: 1.31 - 2.62 ft/sec, indicative of limited community ambulator General Gait Details: generally steady, stiff, but able maintain balance against moderat challenge including abrupt directional changes, scanning, backing up, stepping over low obstacles and negotiating steps.   Stairs Stairs: Yes Stairs assistance: Supervision Stair Management: One rail Right;Alternating pattern;Step to pattern;Forwards Number of Stairs: 10 General stair comments: knee instability, but safe with rail    Wheelchair Mobility    Modified Rankin (Stroke Patients Only) Modified Rankin (Stroke Patients Only) Modified Rankin:  Moderate disability     Balance     Sitting balance-Leahy Scale: Good       Standing balance-Leahy Scale: Good                   Standardized Balance Assessment Standardized Balance Assessment : Dynamic Gait Index          Cognition Arousal/Alertness: Awake/alert Behavior During Therapy: WFL for tasks assessed/performed Overall Cognitive Status: Within Functional Limits for tasks assessed                                        Exercises      General Comments        Pertinent Vitals/Pain Pain Assessment: No/denies pain    Home Living                      Prior Function            PT Goals (current goals can now be found in the care plan section) Acute Rehab PT Goals Patient Stated Goal: return to baseline PT Goal Formulation: With patient Time For Goal Achievement: 02/16/20 Potential to Achieve Goals: Good Progress towards PT goals: Progressing toward goals    Frequency    Min 3X/week      PT Plan Current plan remains appropriate;Frequency needs to be updated    Co-evaluation              AM-PAC PT "6 Clicks" Mobility   Outcome Measure  Help needed turning from your back to your side while in a flat bed  without using bedrails?: None Help needed moving from lying on your back to sitting on the side of a flat bed without using bedrails?: None Help needed moving to and from a bed to a chair (including a wheelchair)?: None Help needed standing up from a chair using your arms (e.g., wheelchair or bedside chair)?: None Help needed to walk in hospital room?: None Help needed climbing 3-5 steps with a railing? : None 6 Click Score: 24    End of Session   Activity Tolerance: Patient tolerated treatment well Patient left: in bed;with call bell/phone within reach Nurse Communication: Mobility status PT Visit Diagnosis: Unsteadiness on feet (R26.81);Other symptoms and signs involving the nervous system (R29.898)      Time: 1250-1314 PT Time Calculation (min) (ACUTE ONLY): 24 min  Charges:  $Gait Training: 8-22 mins $Therapeutic Activity: 8-22 mins                     02/07/2020  Donald Malone., PT Acute Rehabilitation Services [phone]  (pager) [phone]  (office)   Donald Malone 02/07/2020, 4:37 PM

## 2020-02-07 NOTE — Progress Notes (Signed)
Donald Malone  WUJ:[id number] DOB: 07-16-1966 DOA: 02/01/2020 PCP: Patient, No Pcp Per    Brief Narrative:  54 year old with a history of COPD, DM2, HTN, and homelessness who presented to the ED with left-sided facial weakness, left arm weakness, and left leg weakness of acute onset 01/30/2020. 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  In the ED CT head was negative for acute intracranial abnormality.  MRI brain noted an 11 mm acute infarct in the right paramedian pons.  Significant Events: 7/14 admit via [place]  Antimicrobials:  None  Subjective: Sitting up in a bedside chair.  No complaints at all.  Doing very well.  Denies chest pain shortness of breath fevers or chills.  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 Multiple periapical abscesses of maxillary dentition.  MRI  R paramedian pontine infarct. Small vessel disease. Small R maxillary sinus mucous retention cyst.  MRA  No LVO. Mid BA short moderate stenosis. L A2 near occlusion. Widespread moderate small vessel disease.   CTA head moderate mid BA stenosis. Other stenoses:  L A2 high-grade, L P2 moderate, mid R P2 mild to moderate   CTA neck Unremarkable   2D Echo EF 50-55%. No source of embolus. LA mildly dilated  LDL 108  HgbA1c 7.8  No antithrombotic prior to admission, now on aspirin 325 mg daily and clopidogrel 75 mg daily DAPT for 3 months and then ASA alone due to large vessel stenosis.    Therapy recommendations:  Outpt OT, Outpt PT  Basilar artery stenosis Stroke Team suggests medical management for now with dual antiplatelet therapy -consider adding high-dose  statin if LFTs normalize  Cocaine abuse UDS positive for cocaine -patient counseled on absolute need to fully abstain from cocaine and states he is willing - denies habitual use   DM2 A1c 6.26 September 2019 and 7.8 this admission - not on any medications at presentation - CBG for the most part well controlled at present - glucophage initiated this hospital stay   HLD LDL 108 with goal of less than 70 -unable to initiate statin at present as LFTs are mildly elevated -viral hepatitis panel unrevealing -consider initiating high-dose statin if LFTs normalize  HTN Not on any medications at admission - have initiated medical therapy utilizing low-dose diuretic and ACE inhibitor given his diabetes - BP improved   Homeless Patient also does not have a PCP or access to medications -TOC consulted - medically ready for d/c as soon as a shelter can be identified   Possible Dental caries Multiple periapical abscesses of maxillary dentition suggested on CT imaging - patient is fully asymptomatic therefore I have chosen not to dose with antibiotics for now  DVT prophylaxis: Lovenox Code Status: FULL CODE Family Communication:  Status is: Inpatient  Remains inpatient appropriate because:Unsafe d/c plan   Dispo: The patient is from: Home              Anticipated d/c is to: homeless shelter              Anticipated d/c date is: 1 day  Patient currently is medically stable to d/c.   Consultants:  Stroke Team  Objective: Blood pressure 136/85, pulse (!) 57, temperature 98.3 F (36.8 C), temperature source Oral, resp. rate 18, height 5\' 2"  (1.575 m), weight 98.9 kg, SpO2 99 %.  Intake/Output Summary (Last 24 hours) at 02/07/2020 0959 Last data filed at 02/07/2020 0752 Gross per 24 hour  Intake 358 ml  Output --  Net 358 ml   Filed Weights   02/03/20 1700  Weight: 98.9 kg    Examination: General: NAD Lungs: CTA B without wheezing Cardiovascular: RRR Abdomen: NT/ND, soft, bs+,  no mass  Extremities: No edema B LE   CBC: Recent Labs  Lab 02/01/20 1043 02/02/20 0423  WBC 4.7 3.9*  HGB 14.1 13.3  HCT 42.7 41.3  MCV 87.5 88.6  PLT 156 145*   Basic Metabolic Panel: Recent Labs  Lab 02/02/20 0423 02/04/20 0748 02/07/20 0409  NA 141 137 139  K 3.5 3.5 3.5  CL 105 104 107  CO2 26 23 25   GLUCOSE 185* 167* 124*  BUN 13 9 17   CREATININE 1.08 0.96 1.17  CALCIUM 8.9 8.9 9.3   GFR: Estimated Creatinine Clearance: 75.5 mL/min (by C-G formula based on SCr of 1.17 mg/dL).  Liver Function Tests: Recent Labs  Lab 02/02/20 0423 02/04/20 0748 02/07/20 0409  AST 141* 85* 67*  ALT 135* 115* 112*  ALKPHOS 68 65 61  BILITOT 0.4 1.1 0.8  PROT 6.3* 6.3* 6.4*  ALBUMIN 3.2* 3.1* 3.2*    Cardiac Enzymes: Recent Labs  Lab 02/02/20 0423  CKTOTAL 189    HbA1C: Hgb A1c MFr Bld  Date/Time Value Ref Range Status  02/02/2020 04:23 AM 7.8 (H) 4.8 - 5.6 % Final    Comment:    (NOTE) Pre diabetes:          5.7%-6.4%  Diabetes:              >6.4%  Glycemic control for   <7.0% adults with diabetes   09/26/2019 08:51 AM 6.8 (H) 4.8 - 5.6 % Final    Comment:    (NOTE) Pre diabetes:          5.7%-6.4% Diabetes:              >6.4% Glycemic control for   <7.0% adults with diabetes     CBG: Recent Labs  Lab 02/06/20 1158 02/06/20 1602 02/06/20 2109 02/07/20 0609 02/07/20 0750  GLUCAP 99 158* 153* 121* 195*    Recent Results (from the past 240 hour(s))  SARS Coronavirus 2 by RT PCR (hospital order, performed in [facility] hospital lab) Nasopharyngeal Nasopharyngeal Swab     Status: None   Collection Time: 02/01/20  9:27 PM   Specimen: Nasopharyngeal Swab  Result Value Ref Range Status   SARS Coronavirus 2 NEGATIVE NEGATIVE Final    Comment: (NOTE) SARS-CoV-2 target nucleic acids are NOT DETECTED.  The SARS-CoV-2 RNA is generally detectable in upper and lower respiratory specimens during the acute phase of infection. The lowest concentration  of SARS-CoV-2 viral copies this assay can detect is 250 copies / mL. A negative result does not preclude SARS-CoV-2 infection and should not be used as the sole basis for treatment or other patient management decisions.  A negative result may occur with improper specimen collection / handling, submission of specimen other than nasopharyngeal swab, presence of viral mutation(s) within the areas targeted by this assay, and inadequate number of viral copies (<250 copies / mL). A negative  result must be combined with clinical observations, patient history, and epidemiological information.  Fact Sheet for Patients:   BoilerBrush.com.cy  Fact Sheet for Healthcare Providers: https://pope.com/  This test is not yet approved or  cleared by the [place] FDA and has been authorized for detection and/or diagnosis of SARS-CoV-2 by FDA under an Emergency Use Authorization (EUA).  This EUA will remain in effect (meaning this test can be used) for the duration of the COVID-19 declaration under Section 564(b)(1) of the Act, 21 U.S.C. section 360bbb-3(b)(1), unless the authorization is terminated or revoked sooner.  Performed at [facility], [address] [place], [place] [address]      Scheduled Meds: . aspirin EC  325 mg Oral Daily  . clopidogrel  75 mg Oral Daily  . enalapril  5 mg Oral Daily  . enoxaparin (LOVENOX) injection  50 mg Subcutaneous Q24H  . hydrochlorothiazide  12.5 mg Oral Daily  . insulin aspart  0-5 Units Subcutaneous QHS  . insulin aspart  0-9 Units Subcutaneous TID WC  . metFORMIN  500 mg Oral BID WC     LOS: 5 days   Lonia Blood, MD [facility] Office  [phone] Pager - Text Page per Loretha Stapler  If 7PM-7AM, please contact night-coverage per Amion 02/07/2020, 9:59 AM

## 2020-02-07 NOTE — TOC Progression Note (Signed)
Transition of Care [facility]) - Progression Note    Patient Details  Name: Donald Malone MRN: [id number] Date of Birth: [birth year]  Transition of Care [facility] CM/SW Contact  Baldemar [place], [place] Phone Number: 02/07/2020, 5:03 PM  Clinical Narrative:   Patient said that he received a call from [facility] that they should be able to take him, but did not specify when. CSW to try to reach them to determine when a bed might be available for the patient.    Expected Discharge Plan: Homeless Shelter Barriers to Discharge: Continued Medical Work up, Inadequate or no insurance  Expected Discharge Plan and Services Expected Discharge Plan: Homeless Shelter In-house Referral: Clinical Social Work Discharge Planning Services: CM Consult   Living arrangements for the past 2 months: Homeless                                       Social Determinants of Health (SDOH) Interventions    Readmission Risk Interventions No flowsheet data found.

## 2020-02-08 DIAGNOSIS — J449 Chronic obstructive pulmonary disease, unspecified: Secondary | ICD-10-CM

## 2020-02-08 LAB — GLUCOSE, CAPILLARY
Glucose-Capillary: 101 mg/dL — ABNORMAL HIGH (ref 70–99)
Glucose-Capillary: 134 mg/dL — ABNORMAL HIGH (ref 70–99)
Glucose-Capillary: 104 mg/dL — ABNORMAL HIGH (ref 70–99)

## 2020-02-08 MED ORDER — ASPIRIN 325 MG PO TBEC: 325.0000 mg | DELAYED_RELEASE_TABLET | Freq: Every day | ORAL | 11 refills | Status: DC

## 2020-02-08 MED ORDER — HYDROCHLOROTHIAZIDE 12.5 MG PO CAPS: 12.5000 mg | ORAL_CAPSULE | Freq: Every day | ORAL | 0 refills | Status: DC

## 2020-02-08 MED ORDER — CLOPIDOGREL BISULFATE 75 MG PO TABS: 75.0000 mg | ORAL_TABLET | Freq: Every day | ORAL | 2 refills | Status: DC

## 2020-02-08 MED ORDER — METFORMIN HCL 500 MG PO TABS: 500.0000 mg | ORAL_TABLET | Freq: Two times a day (BID) | ORAL | 0 refills | Status: DC

## 2020-02-08 MED ORDER — ENALAPRIL MALEATE 5 MG PO TABS: 5.0000 mg | ORAL_TABLET | Freq: Every day | ORAL | 0 refills | Status: DC

## 2020-02-08 MED FILL — metFORMIN HCL 500 MG TABS: 500 | 30 days supply | Qty: 60 | Fill #0

## 2020-02-08 MED FILL — CLOPIDOGREL 75 MG TABLET: 75 | 30 days supply | Qty: 30 | Fill #0

## 2020-02-08 MED FILL — HYDROCHLOROTHIAZIDE 12.5 MG: 12.5 | 30 days supply | Qty: 30 | Fill #0

## 2020-02-08 MED FILL — ASPIRIN EC 325 MG TABLET: 325 | 30 days supply | Qty: 30 | Fill #0

## 2020-02-08 MED FILL — ENALAPRIL MALEATE 5 MG TABS: 5 | 30 days supply | Qty: 30 | Fill #0

## 2020-02-08 NOTE — Plan of Care (Signed)

## 2020-02-08 NOTE — Progress Notes (Signed)
Occupational Therapy Treatment Patient Details Name: Donald Malone MRN: [id number] DOB: 31-Mar-1966 Today's Date: 02/08/2020    History of present illness 54 y.o male presenting with L sided weakness. 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 PMH includes COPD, type 2 diabetes mellitus, hypertension, and homelessness   OT comments  Pt making steady progress towards OT goals this session. Session focus on higher level balance challenges related to IADL tasks to facilitate increased FMC and ROM in LUE. Pt able to complete functional mobility in room while completing IADL task of changing bed sheets with no AD and no LOB.pt able to reach out of BOS to collect ADL items with no LOB. Pt states he has been completing HEP with no issues and demo'ing improvement in [facility] and ROM in LUE. DC plan remains appropriate, will follow acutely per POC.    Follow Up Recommendations  Outpatient OT    Equipment Recommendations  None recommended by OT    Recommendations for Other Services      Precautions / Restrictions Precautions Precautions: Fall Restrictions Weight Bearing Restrictions: No       Mobility Bed Mobility               General bed mobility comments: OOB sitting in straight back chair  Transfers Overall transfer level: Independent Equipment used: None Transfers: Sit to/from Stand Sit to Stand: Independent         General transfer comment: no assist needed to complete sit<>stand or higher level functional mobility in room    Balance Overall balance assessment: Needs assistance Sitting-balance support: Feet supported;No upper extremity supported Sitting balance-Leahy Scale: Normal     Standing balance support: During functional activity Standing balance-Leahy Scale: Good Standing balance comment: able to reach out of BOS to collect items on floor with no LOB                           ADL either performed or assessed  with clinical judgement   ADL                           Toilet Transfer: Independent Toilet Transfer Details (indicate cue type and reason): simulated via functional mobility task in room         Functional mobility during ADLs: Independent General ADL Comments: session focus on higher level balance tasks related to IADLs in order to increase safety with functional mobility to decrease risk of falls     Vision       Perception     Praxis      Cognition Arousal/Alertness: Awake/alert Behavior During Therapy: WFL for tasks assessed/performed Overall Cognitive Status: Within Functional Limits for tasks assessed                                 General Comments: pt able to recall all therex        Exercises Other Exercises Other Exercises: encouraged pt to work on folding towels and wash cloths to facilitate increased FMC in LUE Other Exercises: pt able to state all therex and reports feeling like he has adequate resources to increase ROM   Shoulder Instructions       General Comments issued pt handout on CVA prevention and education on stroke symptoms    Pertinent Vitals/ Pain       Pain  Assessment: No/denies pain  Home Living                                          Prior Functioning/Environment              Frequency  Min 2X/week        Progress Toward Goals  OT Goals(current goals can now be found in the care plan section)  Progress towards OT goals: Progressing toward goals  Acute Rehab OT Goals Patient Stated Goal: return to baseline OT Goal Formulation: With patient Time For Goal Achievement: 02/16/20 Potential to Achieve Goals: Good  Plan Discharge plan remains appropriate;Frequency remains appropriate    Co-evaluation                 AM-PAC OT "6 Clicks" Daily Activity     Outcome Measure   Help from another person eating meals?: None Help from another person taking care of personal  grooming?: None Help from another person toileting, which includes using toliet, bedpan, or urinal?: None Help from another person bathing (including washing, rinsing, drying)?: A Little Help from another person to put on and taking off regular upper body clothing?: None Help from another person to put on and taking off regular lower body clothing?: None 6 Click Score: 23    End of Session    OT Visit Diagnosis: Other abnormalities of gait and mobility (R26.89);Hemiplegia and hemiparesis;Muscle weakness (generalized) (M62.81);History of falling (Z91.81) Hemiplegia - Right/Left: Left Hemiplegia - dominant/non-dominant: Non-Dominant Hemiplegia - caused by: Cerebral infarction   Activity Tolerance Patient tolerated treatment well   Patient Left in chair;with call bell/phone within reach;Other (comment) (sitting in straight back chair)   Nurse Communication Mobility status        Time: 1010-1029 OT Time Calculation (min): 19 min  Charges: OT General Charges $OT Visit: 1 Visit OT Treatments $Self Care/Home Management : 8-22 mins  Audery Amel., COTA/L Acute Rehabilitation Services [phone]    Angelina Pih 02/08/2020, 11:19 AM

## 2020-02-08 NOTE — Discharge Summary (Signed)
Physician Discharge Summary  Donald Malone WUJ:[id number] DOB: Nov 04, 1965 DOA: 02/01/2020  PCP: Patient, No Pcp Per  Admit date: 02/01/2020 Discharge date: 02/08/2020  Time spent: 45 minutes  Recommendations for Outpatient Follow-up:  Patient will be discharged to home/[facility].  Patient will need to follow up with primary care provider within one week of discharge.  Repeat LFTs in 2 weeks, if LFTs have normalized will need to be started on a statin medication for your cholesterol. Follow up with neurology. Patient should continue medications as prescribed.  Patient should follow a heart healthy/carb modified diet.   Discharge Diagnoses:  Acute CVA Basilar artery stenosis Cocaine abuse Diabetes mellitus, type 2 Hyperlipidemia with elevated LFTs Essential hypertension  Homeless Possible Dental caries Obesity  Discharge Condition: Stable  Diet recommendation: heart healthy/carb modified  Filed Weights   02/03/20 1700  Weight: 98.9 kg    History of present illness:  On 02/01/2020 by Dr. Marcial Pacas Opyd Donald Malone is a 53 y.o. male with medical history significant for COPD, type 2 diabetes mellitus, hypertension, and homelessness, presented to the emergency department with left-sided weakness.  Patient reports that he was in his usual state of health on 01/30/2020 when he noticed some weakness involving the left side.  He has had persistent left arm and left leg weakness since then, had some transient blurred vision, but denies any headache, numbness, difficulty with speech or swallowing, or fevers.  He has been able to ambulate, though with some difficulty.  He denies any chest pain, palpitations, cough, or shortness of breath.  Patient was in the [employer] and currently works for a [employer] but has been homeless for the past 2 years, living on the streets, does not have a PCP, and has not been taking any medications recently.  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  Multiple periapical abscesses of maxillary dentition -MRI brain showed right paramedian pontine infarct.  Small vessel disease.  Small right maxillary sinus mucous retention cyst. -MRA showed no LVO.  Mid BA short to moderate stenosis.  Left A2 near occlusion.  Widespread moderate small vessel disease. -CTA head showed moderate mid PA stenosis.  Other stenosis: L A2 high-grade, L P2 moderate, mid R P2 mild to moderate -CTA neck unremarkable -Echocardiogram shows EF of 50 to 55%.  No source of embolus.  LA mildly dilated. -LDL 108, hemoglobin A1c 7.8 -No antithrombotic prior to admission.  Now on aspirin 325 daily, Plavix 75 mg daily.  DAPT for 3 months and aspirin alone due to large vessel stenosis. -PT and OT recommended outpatient therapy  Basilar artery stenosis -Stroke Team suggests medical management for now with dual antiplatelet therapy -consider adding high-dose statin if LFTs normalize  Cocaine abuse -UDS positive for cocaine -patient counseled on absolute need to fully abstain from cocaine and states he is willing - denies habitual use   Diabetes mellitus, type 2 -A1c 6.26 September 2019 and 7.8 this admission  -not on any medications at presentation  -Started on Metformin  Hyperlipidemia with elevated LFTs -LDL 108 with goal of less than 70  -unable to initiate statin at present as LFTs are mildly elevated  -viral hepatitis panel unrevealing -Will need statin once LFTs normalized  Essential hypertension  -was not on any medications prior to admission -Continue enalapril, HCTZ   Homeless Patient also does  not have a PCP or access to medications -TOC consulted - medically ready for d/c as soon as a shelter can be identified   Possible Dental caries -Multiple periapical abscesses of maxillary dentition  suggested on CT imaging  -Since patient is aysmptomatic, no antibiotics were started. Patient to follow up with dentistry  Obesity -BMI 39.87 -Will need to see his PCP for lifestyle modifications  Procedures: Echocardiogram  Consultations: Neurology   Discharge Exam: Vitals:   02/08/20 0745 02/08/20 1147  BP: 138/86 (!) 144/86  Pulse: (!) 56 74  Resp: 16 16  Temp: 98.7 F (37.1 C) 98.4 F (36.9 C)  SpO2: 98% 100%     General: Well developed, well nourished, NAD, appears stated age  HEENT: NCAT, mucous membranes moist.  Cardiovascular: S1 S2 auscultated, RRR  Respiratory: Clear to auscultation bilaterally   Abdomen: Soft, obese, nontender, nondistended, + bowel sounds  Extremities: warm dry without cyanosis clubbing or edema  Neuro: AAOx3, nonfocal  Psych: Appropriate mood and affect  Discharge Instructions Discharge Instructions    Ambulatory referral to Neurology   Complete by: As directed    Follow up with stroke clinic NP (Donald Malone or Donald Malone, if both not available, consider Donald Malone, or Donald Malone) at [facility] in about 4 weeks. Thanks.   Ambulatory referral to Occupational Therapy   Complete by: As directed    Ambulatory referral to Physical Therapy   Complete by: As directed    Discharge instructions   Complete by: As directed    Patient will be discharged to home/[facility].  Patient will need to follow up with primary care provider within one week of discharge.  Repeat LFTs in 2 weeks, if LFTs have normalized will need to be started on a statin medication for your cholesterol and stroke prevention. Follow up with neurology. Patient should continue medications as prescribed.  Patient should follow a heart healthy/carb modified diet. Take plavix and aspirin together for 3 months, then aspirin alone thereafter.     Allergies as of 02/08/2020   No Known Allergies     Medication List    TAKE these medications   aspirin 325 MG EC  tablet Take 1 tablet (325 mg total) by mouth daily. Start taking on: February 09, 2020   clopidogrel 75 MG tablet Commonly known as: PLAVIX Take 1 tablet (75 mg total) by mouth daily. Start taking on: February 09, 2020   enalapril 5 MG tablet Commonly known as: VASOTEC Take 1 tablet (5 mg total) by mouth daily.   hydrochlorothiazide 12.5 MG capsule Commonly known as: MICROZIDE Take 1 capsule (12.5 mg total) by mouth daily. Start taking on: February 09, 2020   metFORMIN 500 MG tablet Commonly known as: Glucophage Take 1 tablet (500 mg total) by mouth 2 (two) times daily with a meal.      No Known Allergies  Follow-up Information    [facility]. Schedule an appointment as soon as possible for a visit in 4 week(s).   Specialty: Neurology Contact information: [address] [place] [address] [phone]       [facility] Follow up on 02/13/2020.   Why: Your appt is at 2:10 pm. Please arrive early and bring a picture ID and your current medications. Contact information: [place] [address] [phone]       [facility] Follow up.   Why: You may use this location for your  pharmacy needs. they will assist with the cost of your medications. Contact information: [address] [place] [address] [phone]               The results of significant diagnostics from this hospitalization (including imaging, microbiology, ancillary and laboratory) are listed below for reference.    Significant Diagnostic Studies: CT ANGIO HEAD W OR WO CONTRAST  Result Date: 02/02/2020 CLINICAL DATA:  Left-sided weakness with pontine infarction on MRI EXAM: CT ANGIOGRAPHY HEAD AND NECK TECHNIQUE: Multidetector CT imaging of the head and neck was performed using the standard protocol during bolus administration of intravenous  contrast. Multiplanar CT image reconstructions and MIPs were obtained to evaluate the vascular anatomy. Carotid stenosis measurements (when applicable) are obtained utilizing NASCET criteria, using the distal internal carotid diameter as the denominator. CONTRAST:  46mL OMNIPAQUE IOHEXOL 350 MG/ML SOLN COMPARISON:  Correlation made with MRI 02/01/2020 FINDINGS: CT HEAD Brain: There is no acute intracranial hemorrhage, mass effect, or edema. Gray-white differentiation is preserved. The small right pontine infarct is not visible by CT. Patchy hypoattenuation in the supratentorial white matter is nonspecific but may reflect mild chronic microvascular ischemic changes. There is no extra-axial fluid collection. Ventricles and sulci are within normal limits in size and configuration. Vascular: There is atherosclerotic calcification at the skull base. Skull: Calvarium is unremarkable. Sinuses/Orbits: No acute finding. Other: None. Review of the MIP images confirms the above findings CTA NECK Aortic arch: Great vessel origins are patent. Right carotid system: Patent. There is no measurable stenosis at the ICA origin. Left carotid system: Patent. Minimal calcified plaque at the ICA origin without measurable stenosis. Vertebral arteries: Patent and codominant. Skeleton: No significant osseous abnormality. Other neck: Subcentimeter left thyroid nodule for which no further follow-up is required per current guidelines. No adenopathy. Upper chest: No apical lung mass. Review of the MIP images confirms the above findings CTA HEAD Anterior circulation: Intracranial internal carotid arteries are patent with atherosclerotic irregularity and mild calcified and noncalcified plaque. There is mild to moderate stenosis, greatest along the left supraclinoid portion. Anterior cerebral arteries are patent. There is short segment high-grade stenosis of the left A2 ACA. Anterior communicating artery is present. Middle cerebral arteries are  patent with atherosclerotic irregularity. Posterior circulation: Intracranial vertebral arteries are patent. Basilar artery is patent with moderate focal stenosis at the midportion. Posterior cerebral arteries are patent with atherosclerotic irregularity. There is moderate stenosis of the proximal left P2 PCA and mild to moderate stenosis of the mid right P2 PCA. Venous sinuses: Patent as allowed by contrast bolus timing. Review of the MIP images confirms the above findings IMPRESSION: No acute intracranial hemorrhage. Small pontine infarct is not visible by CT. No hemodynamically significant stenosis in the neck. Intracranial atherosclerosis with stenoses as described including moderate stenosis of the mid basilar. Electronically Signed   By: Guadlupe Spanish M.D.   On: 02/02/2020 10:07   CT Head Wo Contrast  Result Date: 02/01/2020 CLINICAL DATA:  Generalized weakness, fatigue, dizziness, left arm weakness EXAM: CT HEAD WITHOUT CONTRAST TECHNIQUE: Contiguous axial images were obtained from the base of the skull through the vertex without intravenous contrast. COMPARISON:  None. FINDINGS: Brain: Normal anatomic configuration. No abnormal intra or extra-axial mass lesion or fluid collection. No abnormal mass effect or midline shift. No evidence of acute intracranial hemorrhage or infarct. Ventricular size is normal. Cerebellum unremarkable. Vascular: Unremarkable Skull: Intact Sinuses/Orbits: Paranasal sinuses are clear. Orbits are unremarkable. Other: Mastoid air cells and middle ear  cavities are clear. Multiple periapical abscesses are noted involving the visualized maxillary dentition IMPRESSION: No acute intracranial abnormality. Multiple periapical abscesses involving the visualized maxillary dentition Electronically Signed   By: Helyn Numbers MD   On: 02/01/2020 17:21   CT ANGIO NECK W OR WO CONTRAST  Result Date: 02/02/2020 CLINICAL DATA:  Left-sided weakness with pontine infarction on MRI EXAM: CT  ANGIOGRAPHY HEAD AND NECK TECHNIQUE: Multidetector CT imaging of the head and neck was performed using the standard protocol during bolus administration of intravenous contrast. Multiplanar CT image reconstructions and MIPs were obtained to evaluate the vascular anatomy. Carotid stenosis measurements (when applicable) are obtained utilizing NASCET criteria, using the distal internal carotid diameter as the denominator. CONTRAST:  75mL OMNIPAQUE IOHEXOL 350 MG/ML SOLN COMPARISON:  Correlation made with MRI 02/01/2020 FINDINGS: CT HEAD Brain: There is no acute intracranial hemorrhage, mass effect, or edema. Gray-white differentiation is preserved. The small right pontine infarct is not visible by CT. Patchy hypoattenuation in the supratentorial white matter is nonspecific but may reflect mild chronic microvascular ischemic changes. There is no extra-axial fluid collection. Ventricles and sulci are within normal limits in size and configuration. Vascular: There is atherosclerotic calcification at the skull base. Skull: Calvarium is unremarkable. Sinuses/Orbits: No acute finding. Other: None. Review of the MIP images confirms the above findings CTA NECK Aortic arch: Great vessel origins are patent. Right carotid system: Patent. There is no measurable stenosis at the ICA origin. Left carotid system: Patent. Minimal calcified plaque at the ICA origin without measurable stenosis. Vertebral arteries: Patent and codominant. Skeleton: No significant osseous abnormality. Other neck: Subcentimeter left thyroid nodule for which no further follow-up is required per current guidelines. No adenopathy. Upper chest: No apical lung mass. Review of the MIP images confirms the above findings CTA HEAD Anterior circulation: Intracranial internal carotid arteries are patent with atherosclerotic irregularity and mild calcified and noncalcified plaque. There is mild to moderate stenosis, greatest along the left supraclinoid portion. Anterior  cerebral arteries are patent. There is short segment high-grade stenosis of the left A2 ACA. Anterior communicating artery is present. Middle cerebral arteries are patent with atherosclerotic irregularity. Posterior circulation: Intracranial vertebral arteries are patent. Basilar artery is patent with moderate focal stenosis at the midportion. Posterior cerebral arteries are patent with atherosclerotic irregularity. There is moderate stenosis of the proximal left P2 PCA and mild to moderate stenosis of the mid right P2 PCA. Venous sinuses: Patent as allowed by contrast bolus timing. Review of the MIP images confirms the above findings IMPRESSION: No acute intracranial hemorrhage. Small pontine infarct is not visible by CT. No hemodynamically significant stenosis in the neck. Intracranial atherosclerosis with stenoses as described including moderate stenosis of the mid basilar. Electronically Signed   By: Guadlupe Spanish M.D.   On: 02/02/2020 10:07   MR ANGIO HEAD WO CONTRAST  Result Date: 02/02/2020 CLINICAL DATA:  Initial evaluation for generalized weakness, fatigue, left-sided weakness. Known stroke on prior brain MRI. EXAM: MRA HEAD WITHOUT CONTRAST TECHNIQUE: Angiographic images of the Circle of Willis were obtained using MRA technique without intravenous contrast. COMPARISON:  Prior brain MRI from earlier the same day. FINDINGS: ANTERIOR CIRCULATION: Examination mildly degraded by motion artifact. Visualized distal cervical segments of the internal carotid arteries are patent with antegrade flow. Petrous segments widely patent. Scattered atheromatous irregularity within the cavernous/supraclinoid ICAs without high-grade stenosis. A1 segments patent bilaterally. Normal anterior communicating artery complex. Focal severe stenosis involving the mid left A2 segment is seen (series 5, image 120).  Left ACA perfused distally. Scattered atheromatous irregularity within the right ACA without high-grade stenosis. M1  segments patent without high-grade stenosis. Normal MCA bifurcations. Moderate distal small vessel atheromatous irregularity seen throughout the MCA branches bilaterally. POSTERIOR CIRCULATION: Vertebral arteries widely patent to the vertebrobasilar junction. Left vertebral slightly dominant. Neither PICA well visualized. Short-segment moderate stenosis noted within the mid basilar artery (series 5, image 76). Basilar patent distally. Superior cerebral arteries patent bilaterally. Both PCAs primarily supplied via the basilar. Mild atheromatous irregularity throughout both PCAs without high-grade stenosis. No intracranial aneurysm. IMPRESSION: 1. Negative intracranial MRA for large vessel occlusion. 2. Short-segment moderate stenosis within the mid basilar artery. 3. Severe near occlusive left A2 stenosis. 4. Moderate distal small vessel atheromatous irregularity throughout the intracranial circulation. Electronically Signed   By: Rise Mu M.D.   On: 02/02/2020 00:43   MR BRAIN WO CONTRAST  Result Date: 02/01/2020 CLINICAL DATA:  Provided history: 2 days of left-sided weakness EXAM: MRI HEAD WITHOUT CONTRAST TECHNIQUE: Multiplanar, multiecho pulse sequences of the brain and surrounding structures were obtained without intravenous contrast. COMPARISON:  Head CT performed earlier the same day 02/01/2020 FINDINGS: Brain: The examination is intermittently motion degraded. Most notably, there is severe motion degradation of the coronal T2 weighted sequence. Cerebral volume is normal for age. There is an 11 mm acute infarct within the paramedian right pons (series 5, images 65-67). Corresponding T2/FLAIR hyperintensity at this site. Mild patchy T2/FLAIR hyperintensity within the cerebral white matter is nonspecific, but consistent with chronic small vessel ischemic disease. No evidence of intracranial mass. No chronic intracranial blood products are identified. No extra-axial fluid collection. No midline  shift. Vascular: Expected proximal arterial flow voids. Skull and upper cervical spine: No focal marrow lesion. Sinuses/Orbits: Visualized orbits show no acute finding. Small right maxillary sinus mucous retention cyst. No significant mastoid effusion. IMPRESSION: Motion degraded examination as described. 11 mm acute infarct within the paramedian right pons. Mild chronic small vessel ischemic changes within the cerebral white matter. Small right maxillary sinus mucous retention cyst Electronically Signed   By: Jackey Loge DO   On: 02/01/2020 20:36   ECHOCARDIOGRAM COMPLETE  Result Date: 02/02/2020    ECHOCARDIOGRAM REPORT   Patient Name:   TREYVON BLAHUT Date of Exam: 02/02/2020 Medical Rec #:  [id number]           Height:       62.0 in Accession #:    [id number]          Weight:       225.0 lb Date of Birth:  1966-05-29           BSA:          2.010 m Patient Age:    54 years            BP:           116/77 mmHg Patient Gender: M                   HR:           55 bpm. Exam Location:  Inpatient Procedure: 2D Echo, Color Doppler and Cardiac Doppler Indications:    Stroke i163.9  History:        Patient has no prior history of Echocardiogram examinations.                 COPD; Risk Factors:Hypertension and Diabetes.  Sonographer:    Irving Burton Senior RDCS Referring Phys: [id number] TIMOTHY S OPYD IMPRESSIONS  1. Left ventricular ejection fraction, by estimation, is 50 to 55%. The left ventricle has low normal function. The left ventricle demonstrates regional wall motion abnormalities (see scoring diagram/findings for description). Inferior hypokinesis. Lateral wall not well visalized. There is mild left ventricular hypertrophy. Left ventricular diastolic parameters are consistent with Grade III diastolic dysfunction (restrictive). Elevated left atrial pressure.  2. Right ventricular systolic function is normal. The right ventricular size is normal. Tricuspid regurgitation signal is inadequate for assessing PA  pressure.  3. Left atrial size was mildly dilated.  4. The mitral valve is normal in structure. No evidence of mitral valve regurgitation.  5. The aortic valve was not well visualized. Aortic valve regurgitation is not visualized. No aortic stenosis is present.  6. The inferior vena cava is dilated in size with >50% respiratory variability, suggesting right atrial pressure of 8 mmHg. FINDINGS  Left Ventricle: Left ventricular ejection fraction, by estimation, is 50 to 55%. The left ventricle has low normal function. The left ventricle demonstrates regional wall motion abnormalities. The left ventricular internal cavity size was normal in size. There is mild left ventricular hypertrophy. Left ventricular diastolic parameters are consistent with Grade III diastolic dysfunction (restrictive). Elevated left atrial pressure. Right Ventricle: The right ventricular size is normal. Right vetricular wall thickness was not assessed. Right ventricular systolic function is normal. Tricuspid regurgitation signal is inadequate for assessing PA pressure. Left Atrium: Left atrial size was mildly dilated. Right Atrium: Right atrial size was normal in size. Pericardium: Trivial pericardial effusion is present. Mitral Valve: The mitral valve is normal in structure. No evidence of mitral valve regurgitation. Tricuspid Valve: The tricuspid valve is normal in structure. Tricuspid valve regurgitation is not demonstrated. Aortic Valve: The aortic valve was not well visualized. Aortic valve regurgitation is not visualized. No aortic stenosis is present. Pulmonic Valve: The pulmonic valve was not well visualized. Pulmonic valve regurgitation is not visualized. Aorta: The aortic root and ascending aorta are structurally normal, with no evidence of dilitation. Venous: The inferior vena cava is dilated in size with greater than 50% respiratory variability, suggesting right atrial pressure of 8 mmHg. IAS/Shunts: The interatrial septum was not  well visualized.  LEFT VENTRICLE PLAX 2D LVIDd:         5.00 cm  Diastology LVIDs:         3.40 cm  LV e' lateral:   5.26 cm/s LV PW:         1.20 cm  LV E/e' lateral: 16.0 LV IVS:        1.30 cm  LV e' medial:    4.99 cm/s LVOT diam:     1.90 cm  LV E/e' medial:  16.8 LV SV:         56 LV SV Index:   28 LVOT Area:     2.84 cm  RIGHT VENTRICLE RV S prime:     10.70 cm/s TAPSE (M-mode): 2.4 cm LEFT ATRIUM             Index       RIGHT ATRIUM           Index LA diam:        4.50 cm 2.24 cm/m  RA Area:     15.50 cm LA Vol (A2C):   86.6 ml 43.09 ml/m RA Volume:   44.20 ml  21.99 ml/m LA Vol (A4C):   55.3 ml 27.51 ml/m LA Biplane Vol: 71.0 ml 35.33 ml/m  AORTIC VALVE LVOT Vmax:   95.60  cm/s LVOT Vmean:  64.600 cm/s LVOT VTI:    0.197 m  AORTA Ao Root diam: 3.70 cm Ao Asc diam:  3.40 cm MITRAL VALVE MV Area (PHT): 3.77 cm    SHUNTS MV Decel Time: 201 msec    Systemic VTI:  0.20 m MV E velocity: 84.00 cm/s  Systemic Diam: 1.90 cm MV A velocity: 36.70 cm/s MV E/A ratio:  2.29 Epifanio Lesches MD Electronically signed by Epifanio Lesches MD Signature Date/Time: 02/02/2020/9:51:37 AM    Final     Microbiology: Recent Results (from the past 240 hour(s))  SARS Coronavirus 2 by RT PCR (hospital order, performed in [facility] hospital lab) Nasopharyngeal Nasopharyngeal Swab     Status: None   Collection Time: 02/01/20  9:27 PM   Specimen: Nasopharyngeal Swab  Result Value Ref Range Status   SARS Coronavirus 2 NEGATIVE NEGATIVE Final    Comment: (NOTE) SARS-CoV-2 target nucleic acids are NOT DETECTED.  The SARS-CoV-2 RNA is generally detectable in upper and lower respiratory specimens during the acute phase of infection. The lowest concentration of SARS-CoV-2 viral copies this assay can detect is 250 copies / mL. A negative result does not preclude SARS-CoV-2 infection and should not be used as the sole basis for treatment or other patient management decisions.  A negative result may occur  with improper specimen collection / handling, submission of specimen other than nasopharyngeal swab, presence of viral mutation(s) within the areas targeted by this assay, and inadequate number of viral copies (<250 copies / mL). A negative result must be combined with clinical observations, patient history, and epidemiological information.  Fact Sheet for Patients:   BoilerBrush.com.cy  Fact Sheet for Healthcare Providers: https://pope.com/  This test is not yet approved or  cleared by the [place] FDA and has been authorized for detection and/or diagnosis of SARS-CoV-2 by FDA under an Emergency Use Authorization (EUA).  This EUA will remain in effect (meaning this test can be used) for the duration of the COVID-19 declaration under Section 564(b)(1) of the Act, 21 U.S.C. section 360bbb-3(b)(1), unless the authorization is terminated or revoked sooner.  Performed at [facility], [address], [place], [place] [address]      Labs: Basic Metabolic Panel: Recent Labs  Lab 02/02/20 0423 02/04/20 0748 02/07/20 0409  NA 141 137 139  K 3.5 3.5 3.5  CL 105 104 107  CO2 26 23 25   GLUCOSE 185* 167* 124*  BUN 13 9 17   CREATININE 1.08 0.96 1.17  CALCIUM 8.9 8.9 9.3   Liver Function Tests: Recent Labs  Lab 02/02/20 0423 02/04/20 0748 02/07/20 0409  AST 141* 85* 67*  ALT 135* 115* 112*  ALKPHOS 68 65 61  BILITOT 0.4 1.1 0.8  PROT 6.3* 6.3* 6.4*  ALBUMIN 3.2* 3.1* 3.2*   No results for input(s): LIPASE, AMYLASE in the last 168 hours. No results for input(s): AMMONIA in the last 168 hours. CBC: Recent Labs  Lab 02/02/20 0423  WBC 3.9*  HGB 13.3  HCT 41.3  MCV 88.6  PLT 145*   Cardiac Enzymes: Recent Labs  Lab 02/02/20 0423  CKTOTAL 189   BNP: BNP (last 3 results) Recent Labs    09/26/19 0851  BNP 46.3    ProBNP (last 3 results) No results for input(s): PROBNP in the last 8760  hours.  CBG: Recent Labs  Lab 02/07/20 1657 02/07/20 1817 02/07/20 2121 02/08/20 0639 02/08/20 1145  GLUCAP 132* 152* 120* 101* 134*  Signed:  Edsel PetrinMaryann Rylan Bernard  [facility] 02/08/2020, 1:41 PM

## 2020-02-08 NOTE — Progress Notes (Signed)
Physical Therapy Treatment Patient Details Name: Donald Malone MRN: [id number] DOB: [birth year] Today's Date: 02/08/2020    History of Present Illness 54 y.o male presenting with L sided weakness. 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 PMH includes COPD, type 2 diabetes mellitus, hypertension, and homelessness.  Noted plan is for pt to d/c to shelter today.    PT Comments    Pt continues to demonstrate safe ambulation independently.  Session focused on L LE strengthening, high level balance, cues for normal gait pattern (resting when starting to hyperextend knee), and providing HEP for LE strengthening.   Cont POC while hospitalized.   Follow Up Recommendations  Outpatient PT     Equipment Recommendations  None recommended by PT    Recommendations for Other Services       Precautions / Restrictions Precautions Precautions: None    Mobility  Bed Mobility               General bed mobility comments: OOB sitting in straight back chair  Transfers Overall transfer level: Independent   Transfers: Sit to/from Stand Sit to Stand: Independent            Ambulation/Gait Ambulation/Gait assistance: Independent Gait Distance (Feet): 600 Feet Assistive device: None Gait Pattern/deviations: Step-through pattern     General Gait Details: Generally steady ; pt reported and did note minimal L knee hyperextension with fatigue   Stairs Stairs: Yes Stairs assistance: Supervision Stair Management: One rail Right;Alternating pattern;Forwards Number of Stairs: 15 General stair comments: educated on step to "up with good and down with bad" if knee feeling unstable, but he was able to perform alternating pattern today   Wheelchair Mobility    Modified Rankin (Stroke Patients Only) Modified Rankin (Stroke Patients Only) Pre-Morbid Rankin Score: No symptoms Modified Rankin: Slight disability     Balance Overall balance  assessment: Needs assistance Sitting-balance support: Feet supported;No upper extremity supported Sitting balance-Leahy Scale: Normal     Standing balance support: During functional activity Standing balance-Leahy Scale: Good Standing balance comment: Able to reach outside BOS to grab mask and gown from nightstand; additionally donned gown in standing               High Level Balance Comments: Able to demo good gait speed, quick turns, stepping over/around object, and head turns without LOB            Cognition Arousal/Alertness: Awake/alert Behavior During Therapy: WFL for tasks assessed/performed Overall Cognitive Status: Within Functional Limits for tasks assessed                                 General Comments: pt able to recall all therex      Exercises Other Exercises Other Exercises: Sit to stand x 10 with L leg favored, LAQ with orange theraband x 10, heel raises    General Comments General comments (skin integrity, edema, etc.): Discussed HEP for L LE including Sit to stands with R leg extended to favor L leg, step ups with rail using L leg, and knee ext/LAQ with thera-band tied around chair leg      Pertinent Vitals/Pain Pain Assessment: No/denies pain    Home Living                      Prior Function            PT Goals (current goals  can now be found in the care plan section) Acute Rehab PT Goals Patient Stated Goal: return to baseline and return to work PT Goal Formulation: With patient Time For Goal Achievement: 02/16/20 Potential to Achieve Goals: Good Progress towards PT goals: Progressing toward goals    Frequency    Min 3X/week      PT Plan Current plan remains appropriate    Co-evaluation              AM-PAC PT "6 Clicks" Mobility   Outcome Measure  Help needed turning from your back to your side while in a flat bed without using bedrails?: None Help needed moving from lying on your back to  sitting on the side of a flat bed without using bedrails?: None Help needed moving to and from a bed to a chair (including a wheelchair)?: None Help needed standing up from a chair using your arms (e.g., wheelchair or bedside chair)?: None Help needed to walk in hospital room?: None Help needed climbing 3-5 steps with a railing? : None 6 Click Score: 24    End of Session Equipment Utilized During Treatment: Gait belt Activity Tolerance: Patient tolerated treatment well Patient left: in bed;with call bell/phone within reach Nurse Communication: Mobility status PT Visit Diagnosis: Unsteadiness on feet (R26.81);Other symptoms and signs involving the nervous system (R29.898)     Time: 3762-8315 PT Time Calculation (min) (ACUTE ONLY): 28 min  Charges:  $Gait Training: 8-22 mins $Therapeutic Exercise: 8-22 mins                     Anise Salvo, PT Acute Rehab Services Pager [phone] [facility] [phone]     Rayetta Humphrey 02/08/2020, 5:38 PM

## 2020-02-08 NOTE — Progress Notes (Signed)
Discharge instructions provided.  All questions answered.  Discharged to home/[facility].

## 2020-02-13 ENCOUNTER — Inpatient Hospital Stay (INDEPENDENT_AMBULATORY_CARE_PROVIDER_SITE_OTHER): Payer: Self-pay | Admitting: Primary Care

## 2020-02-22 ENCOUNTER — Encounter: Payer: Self-pay | Admitting: Critical Care Medicine

## 2020-02-22 ENCOUNTER — Other Ambulatory Visit: Payer: Self-pay | Admitting: Critical Care Medicine

## 2020-02-22 MED ORDER — LISINOPRIL 10 MG PO TABS: 10.0000 mg | ORAL_TABLET | Freq: Every day | ORAL | 3 refills | Status: DC

## 2020-02-22 MED ORDER — HYDROCHLOROTHIAZIDE 25 MG PO TABS: 25.0000 mg | ORAL_TABLET | Freq: Every day | ORAL | 3 refills | Status: DC
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
## 2020-02-23 MED FILL — LISINOPRIL 10 MG TABS: 10 | 30 days supply | Qty: 30 | Fill #0

## 2020-02-23 MED FILL — HYDROCHLOROTHIAZIDE 25 MG T: 25 | 30 days supply | Qty: 30 | Fill #0

## 2020-02-29 ENCOUNTER — Encounter: Payer: Self-pay | Admitting: Critical Care Medicine

## 2020-03-01 NOTE — Progress Notes (Signed)
Patient ID: Donald Malone, male   DOB: [birth year], 54 y.o.   MRN: [id number] This a 54 year old male seen at the [facility] in follow-up from 1 week.  Patient states his pain in his arms and hips are better since applying the Voltaren gel topically.  Patient does have a history of hypertension and prior stroke along with diabetes.  Patient maintains a Metformin lisinopril hydrochlorthiazide Plavix and aspirin.  On exam blood pressure is 145/90 remainder the exam is unremarkable.  He is wishing an appointment in the [facility] health and wellness clinic and achieve this for August 17 at 1:30 PM so that we can obtain labs and get him formally connected to the clinic for primary care

## 2020-03-06 ENCOUNTER — Inpatient Hospital Stay: Payer: Self-pay | Admitting: Critical Care Medicine

## 2020-03-08 ENCOUNTER — Encounter: Payer: Self-pay | Admitting: Critical Care Medicine

## 2020-03-09 NOTE — Progress Notes (Signed)
Patient ID: Donald Malone, male   DOB: 03-Nov-1965, 54 y.o.   MRN: [id number] This a 54 year old male seen today in turn at the [facility] he states he is having some left knee pain and he is working in a [employer] and is on his feet all day.  Note on exam blood pressure is 140/75 pulse is 71 saturation 96% room air  We were not able to achieve an appointment this week as I had to cancel my clinic I will reschedule this patient for the following week at the community health and wellness center and he will maintain his current medications

## 2020-03-14 ENCOUNTER — Encounter: Payer: Self-pay | Admitting: Critical Care Medicine

## 2020-03-15 ENCOUNTER — Inpatient Hospital Stay: Payer: Self-pay | Admitting: Critical Care Medicine

## 2020-03-15 NOTE — Progress Notes (Signed)
Patient ID: Donald Malone, male   DOB: 18-Sep-1965, 54 y.o.   MRN: [id number] This is a  54 year old male seen at the [facility] we had attempted to get him an appointment but there was a mixup and he came on a day when the clinic was closed in the afternoon  I have given this patient another appointment for August 31 there are no other acute issues he will establish care at that time

## 2020-03-19 ENCOUNTER — Telehealth: Payer: Self-pay | Admitting: Critical Care Medicine

## 2020-03-19 NOTE — Telephone Encounter (Signed)
This patient called me today and he cannot make his 230 appointment tomorrow please rebook this patient and contact him with the rebooked time this can occur within the next 2 weeks

## 2020-03-20 ENCOUNTER — Ambulatory Visit: Payer: Self-pay | Admitting: Critical Care Medicine

## 2020-03-21 ENCOUNTER — Other Ambulatory Visit: Payer: Self-pay | Admitting: Critical Care Medicine

## 2020-03-21 ENCOUNTER — Encounter: Payer: Self-pay | Admitting: Critical Care Medicine

## 2020-03-21 MED ORDER — LISINOPRIL 10 MG PO TABS: 10.0000 mg | ORAL_TABLET | Freq: Every day | ORAL | 3 refills | Status: DC

## 2020-03-21 MED ORDER — METFORMIN HCL 500 MG PO TABS: 500.0000 mg | ORAL_TABLET | Freq: Two times a day (BID) | ORAL | 0 refills | Status: DC

## 2020-03-21 MED ORDER — HYDROCHLOROTHIAZIDE 25 MG PO TABS: 25.0000 mg | ORAL_TABLET | Freq: Every day | ORAL | 3 refills | Status: DC

## 2020-03-21 MED ORDER — ASPIRIN 325 MG PO TBEC: 325.0000 mg | DELAYED_RELEASE_TABLET | Freq: Every day | ORAL | 1 refills | Status: DC

## 2020-03-21 MED ORDER — NICOTINE 21 MG/24HR TD PT24: 21.0000 mg | MEDICATED_PATCH | Freq: Every day | TRANSDERMAL | 0 refills | Status: DC

## 2020-03-21 MED ORDER — CLOPIDOGREL BISULFATE 75 MG PO TABS: 75.0000 mg | ORAL_TABLET | Freq: Every day | ORAL | 2 refills | Status: DC

## 2020-03-21 MED FILL — HYDROCHLOROTHIAZIDE 25 MG T: 25 | 30 days supply | Qty: 30 | Fill #0

## 2020-03-21 MED FILL — CLOPIDOGREL 75 MG TABLET: 75 | 30 days supply | Qty: 30 | Fill #0

## 2020-03-21 MED FILL — NICOTINE 21 MG/24HR PATCH: 21 | 28 days supply | Qty: 28 | Fill #0

## 2020-03-21 MED FILL — ASPIRIN EC 325 MG TABLET: 325 | 30 days supply | Qty: 30 | Fill #0

## 2020-03-21 MED FILL — LISINOPRIL 10 MG TABS: 10 | 30 days supply | Qty: 30 | Fill #0

## 2020-03-21 MED FILL — METFORMIN HCL 500 MG TABS: 500 | 30 days supply | Qty: 60 | Fill #0

## 2020-03-21 NOTE — Progress Notes (Signed)
Med refills

## 2020-03-21 NOTE — Progress Notes (Signed)
[name] ID: Donald Malone, male   DOB: 06-24-1966, 54 y.o.   MRN: [id number] This patient is seen today in the [facility] he is needing refills on all of his medications.  I thought I given him a 90-day supply but his counts only show 30-day supplies it may be he was only allowed 30 days at a time.  Initial refill was nearly a month ago.  He needs aspirin, clopidogrel, hydrocal thiazide, Metformin, and lisinopril.  Also he would like a nicotine patch to help quit smoking he is down to less than 1 pack a day of cigarettes.  He is not drinking any alcohol.  Prescriptions were sent to the pharmacy and the nurse will pick up

## 2020-04-04 ENCOUNTER — Encounter: Payer: Self-pay | Admitting: Critical Care Medicine

## 2020-04-04 NOTE — Progress Notes (Signed)
Patient ID: Donald Malone, male   DOB: 11-Aug-1965, 54 y.o.   MRN: [id number] This patient is seen today in the [facility] he wanted his blood pressure checked blood pressure today is 140/85 saturation 96% pulse is 90 he states his cigarette use is gone as he is using the nicotine patch which is helped he has an upcoming appointment for me to establish in the clinic  Patient states he is maintaining aspirin, clopidogrel, hydrocal thiazide, lisinopril, Metformin,  Note his blood sugar today was 209

## 2020-04-18 ENCOUNTER — Other Ambulatory Visit: Payer: Self-pay | Admitting: Critical Care Medicine

## 2020-04-18 ENCOUNTER — Other Ambulatory Visit (HOSPITAL_COMMUNITY): Payer: Self-pay | Admitting: Critical Care Medicine

## 2020-04-18 ENCOUNTER — Encounter: Payer: Self-pay | Admitting: Critical Care Medicine

## 2020-04-18 MED ORDER — CLOPIDOGREL BISULFATE 75 MG PO TABS: 75.0000 mg | ORAL_TABLET | Freq: Every day | ORAL | 2 refills | Status: DC

## 2020-04-18 MED ORDER — METFORMIN HCL 500 MG PO TABS: 500.0000 mg | ORAL_TABLET | Freq: Two times a day (BID) | ORAL | 0 refills | Status: DC

## 2020-04-18 MED ORDER — LISINOPRIL 40 MG PO TABS: 40.0000 mg | ORAL_TABLET | Freq: Every day | ORAL | 0 refills | Status: DC

## 2020-04-18 MED ORDER — ASPIRIN 325 MG PO TBEC: 325.0000 mg | DELAYED_RELEASE_TABLET | Freq: Every day | ORAL | 1 refills | Status: DC

## 2020-04-18 MED ORDER — LISINOPRIL 10 MG PO TABS: 10.0000 mg | ORAL_TABLET | Freq: Every day | ORAL | 3 refills | Status: DC

## 2020-04-18 MED ORDER — HYDROCHLOROTHIAZIDE 25 MG PO TABS: 25.0000 mg | ORAL_TABLET | Freq: Every day | ORAL | 3 refills | Status: DC

## 2020-04-18 MED FILL — LISINOPRIL 40 MG TABS: 40 | 90 days supply | Qty: 90 | Fill #0

## 2020-04-18 MED FILL — CLOPIDOGREL 75 MG TABLET: 75 | 45 days supply | Qty: 45 | Fill #0

## 2020-04-18 MED FILL — HYDROCHLOROTHIAZIDE 25 MG T: 25 | 90 days supply | Qty: 90 | Fill #0

## 2020-04-18 MED FILL — METFORMIN HCL 500 MG TABS: 500 | 60 days supply | Qty: 120 | Fill #0

## 2020-04-18 MED FILL — ASPIRIN EC 325 MG TABLET: 325 | 30 days supply | Qty: 30 | Fill #0

## 2020-04-19 NOTE — Progress Notes (Signed)
Patient ID: Donald Malone, male   DOB: 04-28-1966, 54 y.o.   MRN: [id number] This patient is seen in the [facility] needs medication bridging until he is seen in the clinic in October.  History of hypertension  On exam blood pressure 173/98 saturation 97% room air pulse 78 glucose is 151  Patient will receive lisinopril, hydrocal thiazide, aspirin, Plavix, Metformin and he knows to follow-up with his appointment with me in October.  Health and wellness

## 2020-05-02 ENCOUNTER — Encounter: Payer: Self-pay | Admitting: Critical Care Medicine

## 2020-05-02 NOTE — Progress Notes (Signed)
Patient ID: Donald Malone, male   DOB: 26-Apr-1966, 54 y.o.   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

## 2020-05-08 ENCOUNTER — Ambulatory Visit: Payer: Self-pay | Admitting: Critical Care Medicine

## 2020-05-09 ENCOUNTER — Other Ambulatory Visit: Payer: Self-pay | Admitting: Critical Care Medicine

## 2020-05-09 ENCOUNTER — Other Ambulatory Visit (HOSPITAL_COMMUNITY): Payer: Self-pay | Admitting: Critical Care Medicine

## 2020-05-09 ENCOUNTER — Encounter: Payer: Self-pay | Admitting: Critical Care Medicine

## 2020-05-09 MED ORDER — AMLODIPINE BESYLATE 10 MG PO TABS: 10.0000 mg | ORAL_TABLET | Freq: Every day | ORAL | 3 refills | Status: DC

## 2020-05-09 MED FILL — AMLODIPINE BESYLATE 10 MG T: 10 | 30 days supply | Qty: 30 | Fill #0

## 2020-05-09 NOTE — Progress Notes (Signed)
Patient ID: Donald Malone, male   DOB: [birth year], 54 y.o.   MRN: [id number] This is a 54 year old male with hypertension history of stroke he failed his appointment with me yesterday at the clinic secondary to having to go to court  On exam blood pressure 150/90 pulse 79 saturation 97% room air physical exam is unchanged impression is that of poorly controlled hypertension will add amlodipine 10 mg daily to his program and will continue the lisinopril 40 mg daily and hydrochlorthiazide 25 mg daily we will reestablish another appointment for this patient

## 2020-05-09 NOTE — Progress Notes (Signed)
am

## 2020-05-16 ENCOUNTER — Encounter: Payer: Self-pay | Admitting: Critical Care Medicine

## 2020-05-17 NOTE — Progress Notes (Signed)
Patient ID: Donald Malone, male   DOB: 15-Nov-1965, 54 y.o.   MRN: [id number] This is a 54 year old male seen in the [facility] history of hypertension diabetes he is on the Metformin and amlodipine along with hydrochlorthiazide and lisinopril  On exam blood pressure 140/80 pulse 92 saturation 97% room air blood sugar was 110  Exam is unchanged  Impression is that of type 2 diabetes with improved control hypertension with improved control  Plan is to get this patient officially into the clinic to establish at community health and wellness and an appointment was achieved for this patient in November as he missed his last appointment

## 2020-05-23 ENCOUNTER — Other Ambulatory Visit: Payer: Self-pay | Admitting: Critical Care Medicine

## 2020-05-23 ENCOUNTER — Encounter: Payer: Self-pay | Admitting: Critical Care Medicine

## 2020-05-24 ENCOUNTER — Encounter: Payer: Self-pay | Admitting: Critical Care Medicine

## 2020-05-24 NOTE — Progress Notes (Signed)
This is a 54 year old male seen in the [facility] clinic he is here for blood pressure check he has been compliant with his medications  Blood pressure is 110/70 glucose is 170 he has refills on all his medications  He had just had a carbohydrate rich lunch and I had indicated with him the need to cut back on amount of carb I consumption he is taking in  Patient maintains amlodipine 10 mg daily hydrocal thiazide 25 mg daily Zestril 40 mg daily Metformin 500 mg twice daily and aspirin 3 and 25 mg twice daily  No other changes are made at this visit

## 2020-06-06 ENCOUNTER — Other Ambulatory Visit (HOSPITAL_COMMUNITY): Payer: Self-pay | Admitting: Critical Care Medicine

## 2020-06-06 ENCOUNTER — Encounter: Payer: Self-pay | Admitting: Critical Care Medicine

## 2020-06-06 ENCOUNTER — Other Ambulatory Visit: Payer: Self-pay | Admitting: Critical Care Medicine

## 2020-06-06 MED ORDER — AMLODIPINE BESYLATE 10 MG PO TABS: 10.0000 mg | ORAL_TABLET | Freq: Every day | ORAL | 3 refills | Status: DC

## 2020-06-06 MED ORDER — ASPIRIN 325 MG PO TBEC: 325.0000 mg | DELAYED_RELEASE_TABLET | Freq: Every day | ORAL | 1 refills | Status: DC

## 2020-06-06 MED FILL — AMLODIPINE BESYLATE 10 MG T: 10 | 30 days supply | Qty: 30 | Fill #0

## 2020-06-06 MED FILL — ASPIRIN EC 325 MG TABLET: 325 | 30 days supply | Qty: 30 | Fill #0

## 2020-06-07 NOTE — Progress Notes (Signed)
This is a 54 year old male seen today in the [facility] for blood pressure check blood pressure today is 142/80 saturation 98% room air pulse is 80 he needs refills on amlodipine and aspirin and this was processed  Blood sugar was 131  He has a return visit with me on November 23 in the clinic

## 2020-06-12 ENCOUNTER — Ambulatory Visit: Payer: Self-pay | Attending: Critical Care Medicine | Admitting: Critical Care Medicine

## 2020-06-12 ENCOUNTER — Other Ambulatory Visit: Payer: Self-pay

## 2020-06-12 ENCOUNTER — Encounter: Payer: Self-pay | Admitting: Critical Care Medicine

## 2020-06-12 ENCOUNTER — Other Ambulatory Visit: Payer: Self-pay | Admitting: Critical Care Medicine

## 2020-06-12 VITALS — BP 121/72 | HR 95 | Wt 220.3 lb

## 2020-06-12 DIAGNOSIS — J449 Chronic obstructive pulmonary disease, unspecified: Secondary | ICD-10-CM

## 2020-06-12 DIAGNOSIS — I10 Essential (primary) hypertension: Secondary | ICD-10-CM

## 2020-06-12 DIAGNOSIS — I639 Cerebral infarction, unspecified: Secondary | ICD-10-CM

## 2020-06-12 DIAGNOSIS — E119 Type 2 diabetes mellitus without complications: Secondary | ICD-10-CM

## 2020-06-12 DIAGNOSIS — R29818 Other symptoms and signs involving the nervous system: Secondary | ICD-10-CM

## 2020-06-12 DIAGNOSIS — Z1211 Encounter for screening for malignant neoplasm of colon: Secondary | ICD-10-CM

## 2020-06-12 LAB — GLUCOSE, POCT (MANUAL RESULT ENTRY): POC Glucose: 168 mg/dl — AB (ref 70–99)

## 2020-06-12 LAB — POCT GLYCOSYLATED HEMOGLOBIN (HGB A1C): Hemoglobin A1C: 7.4 % — AB (ref 4.0–5.6)

## 2020-06-12 MED ORDER — CLOPIDOGREL BISULFATE 75 MG PO TABS: ORAL_TABLET | ORAL | 2 refills | Status: DC

## 2020-06-12 MED ORDER — LISINOPRIL 40 MG PO TABS: 40.0000 mg | ORAL_TABLET | Freq: Every day | ORAL | 1 refills | Status: DC

## 2020-06-12 MED ORDER — HYDROCHLOROTHIAZIDE 25 MG PO TABS: 25.0000 mg | ORAL_TABLET | Freq: Every day | ORAL | 3 refills | Status: DC

## 2020-06-12 MED ORDER — METFORMIN HCL 500 MG PO TABS: 1000.0000 mg | ORAL_TABLET | Freq: Two times a day (BID) | ORAL | 1 refills | Status: DC

## 2020-06-12 MED ORDER — TRULICITY 0.75 MG/0.5ML ~~LOC~~ SOAJ: 0.7500 mg | SUBCUTANEOUS | 3 refills | Status: AC

## 2020-06-12 MED ORDER — AMLODIPINE BESYLATE 10 MG PO TABS: 10.0000 mg | ORAL_TABLET | Freq: Every day | ORAL | 3 refills | Status: DC

## 2020-06-12 MED FILL — METFORMIN HCL 500 MG TABS: 500 | 30 days supply | Qty: 120 | Fill #0

## 2020-06-12 NOTE — Assessment & Plan Note (Signed)
Hypertension well controlled continue amlodipine and lisinopril as prescribed

## 2020-06-12 NOTE — Assessment & Plan Note (Signed)
Type 2 diabetes not yet well controlled  Increase Metformin to 1000 mg twice daily begin Trulicity 75 mcg weekly  Monitor response obtain microalbumin creatinine urine ratio

## 2020-06-12 NOTE — Assessment & Plan Note (Signed)
Monitor off tobacco use

## 2020-06-12 NOTE — Patient Instructions (Addendum)
Focus on reducing your alcohol intake  Begin Trulicity 1 injection weekly will be trained as to how to use this Increase Metformin to 1000 mg which is 2 tablets twice daily with meals  Pneumovax and tetanus vaccine was given  Obtain your [employer] booster shot call the number below to find where this is being given COVID-19 Vaccine Information can be found at: [email or web address] For questions related to vaccine distribution or appointments, please email [email or web address] or call [phone].    We are checking liver function kidney function blood counts today if the liver function is returned to normal we will be ordering a cholesterol pill, urine for protein level in urine with diabetes  All of your medicines will be refilled at this pharmacy I did send her Trulicity and increase dose of Metformin to our pharmacy today your other medications you will not need to fill immediately as [facility]  We will get you an appointment with the financial counselor to obtain the orange and blue card in [place] financial discounts  No change in your blood pressure medications blood pressure was great today  You will stop your clopidogrel after your current bottle runs out you will continue your aspirin 325 mg daily for stroke prevention  Once we obtain the orange card we can get you a dental referral  Dulaglutide injection What is this medicine? DULAGLUTIDE (DOO la GLOO tide) is used to improve blood sugar control in adults with type 2 diabetes. This medicine may be used with other oral diabetes medicines. This drug may also reduce the risk of heart attack or stroke if you have type 2 diabetes and risk factors for heart disease. This medicine may be used for other purposes; ask your health care provider or pharmacist if you have questions. COMMON BRAND NAME(S): Trulicity What should I  tell my health care provider before I take this medicine? They need to know if you have any of these conditions:  endocrine tumors (MEN 2) or if someone in your family had these tumors  eye disease, vision problems  history of pancreatitis  kidney disease  liver disease  stomach problems  thyroid cancer or if someone in your family had thyroid cancer  an unusual or allergic reaction to dulaglutide, other medicines, foods, dyes, or preservatives  pregnant or trying to get pregnant  breast-feeding How should I use this medicine? This medicine is for injection under the skin of your upper leg (thigh), stomach area, or upper arm. It is usually given once every week (every 7 days). You will be taught how to prepare and give this medicine. Use exactly as directed. Take your medicine at regular intervals. Do not take it more often than directed. If you use this medicine with insulin, you should inject this medicine and the insulin separately. Do not mix them together. Do not give the injections right next to each other. Change (rotate) injection sites with each injection. It is important that you put your used needles and syringes in a special sharps container. Do not put them in a trash can. If you do not have a sharps container, call your pharmacist or healthcare provider to get one. A special MedGuide will be given to you by the pharmacist with each prescription and refill. Be sure to read this information carefully each time. This drug comes with INSTRUCTIONS FOR USE. Ask your pharmacist for directions on how to use this drug. Read the information carefully. Talk  to your pharmacist or health care provider if you have questions. Talk to your pediatrician regarding the use of this medicine in children. Special care may be needed. Overdosage: If you think you have taken too much of this medicine contact a poison control center or emergency room at once. NOTE: This medicine is only for you. Do  not share this medicine with others. What if I miss a dose? If you miss a dose, take it as soon as you can within 3 days after the missed dose. Then take your next dose at your regular weekly time. If it has been longer than 3 days after the missed dose, do not take the missed dose. Take the next dose at your regular time. Do not take double or extra doses. If you have questions about a missed dose, contact your health care provider for advice. What may interact with this medicine?  other medicines for diabetes Many medications may cause changes in blood sugar, these include:  alcohol containing beverages  antiviral medicines for HIV or AIDS  aspirin and aspirin-like drugs  certain medicines for blood pressure, heart disease, irregular heart beat  chromium  diuretics  male hormones, such as estrogens or progestins, birth control pills  fenofibrate  gemfibrozil  isoniazid  lanreotide  male hormones or anabolic steroids  MAOIs like Carbex, Eldepryl, Marplan, Nardil, and Parnate  medicines for weight loss  medicines for allergies, asthma, cold, or cough  medicines for depression, anxiety, or psychotic disturbances  niacin  nicotine  NSAIDs, medicines for pain and inflammation, like ibuprofen or naproxen  octreotide  pasireotide  pentamidine  phenytoin  probenecid  quinolone antibiotics such as ciprofloxacin, levofloxacin, ofloxacin  some herbal dietary supplements  steroid medicines such as prednisone or cortisone  sulfamethoxazole; trimethoprim  thyroid hormones Some medications can hide the warning symptoms of low blood sugar (hypoglycemia). You may need to monitor your blood sugar more closely if you are taking one of these medications. These include:  beta-blockers, often used for high blood pressure or heart problems (examples include atenolol, metoprolol, propranolol)  clonidine  guanethidine  reserpine This list may not describe all  possible interactions. Give your health care provider a list of all the medicines, herbs, non-prescription drugs, or dietary supplements you use. Also tell them if you smoke, drink alcohol, or use illegal drugs. Some items may interact with your medicine. What should I watch for while using this medicine? Visit your doctor or health care professional for regular checks on your progress. Drink plenty of fluids while taking this medicine. Check with your doctor or health care professional if you get an attack of severe diarrhea, nausea, and vomiting. The loss of too much body fluid can make it dangerous for you to take this medicine. A test called the HbA1C (A1C) will be monitored. This is a simple blood test. It measures your blood sugar control over the last 2 to 3 months. You will receive this test every 3 to 6 months. Learn how to check your blood sugar. Learn the symptoms of low and high blood sugar and how to manage them. Always carry a quick-source of sugar with you in case you have symptoms of low blood sugar. Examples include hard sugar candy or glucose tablets. Make sure others know that you can choke if you eat or drink when you develop serious symptoms of low blood sugar, such as seizures or unconsciousness. They must get medical help at once. Tell your doctor or health care professional if  you have high blood sugar. You might need to change the dose of your medicine. If you are sick or exercising more than usual, you might need to change the dose of your medicine. Do not skip meals. Ask your doctor or health care professional if you should avoid alcohol. Many nonprescription cough and cold products contain sugar or alcohol. These can affect blood sugar. Pens should never be shared. Even if the needle is changed, sharing may result in passing of viruses like hepatitis or HIV. Wear a medical ID bracelet or chain, and carry a card that describes your disease and details of your medicine and dosage  times. What side effects may I notice from receiving this medicine? Side effects that you should report to your doctor or health care professional as soon as possible:  allergic reactions like skin rash, itching or hives, swelling of the face, lips, or tongue  breathing problems  changes in vision  diarrhea that continues or is severe  lump or swelling on the neck  severe nausea  signs and symptoms of infection like fever or chills; cough; sore throat; pain or trouble passing urine  signs and symptoms of low blood sugar such as feeling anxious, confusion, dizziness, increased hunger, unusually weak or tired, sweating, shakiness, cold, irritable, headache, blurred vision, fast heartbeat, loss of consciousness  signs and symptoms of kidney injury like trouble passing urine or change in the amount of urine  trouble swallowing  unusual stomach upset or pain  vomiting Side effects that usually do not require medical attention (report to your doctor or health care professional if they continue or are bothersome):  diarrhea  loss of appetite  nausea  pain, redness, or irritation at site where injected  stomach upset This list may not describe all possible side effects. Call your doctor for medical advice about side effects. You may report side effects to FDA at 1-800-FDA-1088. Where should I keep my medicine? Keep out of the reach of children. Store unopened pens in a refrigerator between 2 and 8 degrees C (36 and 46 degrees F). Do not freeze or use if the medicine has been frozen. Protect from light and excessive heat. Store in the carton until use. Each single-dose pen can be kept at room temperature, not to exceed 30 degrees C (86 degrees F) for a total of 14 days, if needed. Throw away any unused medicine after the expiration date on the label. NOTE: This sheet is a summary. It may not cover all possible information. If you have questions about this medicine, talk to your  doctor, pharmacist, or health care provider.  2020 Elsevier/Gold Standard (2019-03-22 09:34:53)  Obesity, Adult Obesity is having too much body fat. Being obese means that your weight is more than what is healthy for you. BMI is a number that explains how much body fat you have. If you have a BMI of 30 or more, you are obese. Obesity is often caused by eating or drinking more calories than your body uses. Changing your lifestyle can help you lose weight. Obesity can cause serious health problems, such as:  Stroke.  Coronary artery disease (CAD).  Type 2 diabetes.  Some types of cancer, including cancers of the colon, breast, uterus, and gallbladder.  Osteoarthritis.  High blood pressure (hypertension).  High cholesterol.  Sleep apnea.  Gallbladder stones.  Infertility problems. What are the causes?  Eating meals each day that are high in calories, sugar, and fat.  Being born with genes that may  make you more likely to become obese.  Having a medical condition that causes obesity.  Taking certain medicines.  Sitting a lot (having a sedentary lifestyle).  Not getting enough sleep.  Drinking a lot of drinks that have sugar in them. What increases the risk?  Having a family history of obesity.  Being an [place] American woman.  Being a Hispanic man.  Living in an area with limited access to: ? [facility], recreation centers, or sidewalks. ? Healthy food choices, such as grocery stores and farmers' markets. What are the signs or symptoms? The main sign is having too much body fat. How is this treated?  Treatment for this condition often includes changing your lifestyle. Treatment may include: ? Changing your diet. This may include making a healthy meal plan. ? Exercise. This may include activity that causes your heart to beat faster (aerobic exercise) and strength training. Work with your doctor to design a program that works for you. ? Medicine to help you lose  weight. This may be used if you are not able to lose 1 pound a week after 6 weeks of healthy eating and more exercise. ? Treating conditions that cause the obesity. ? Surgery. Options may include gastric banding and gastric bypass. This may be done if:  Other treatments have not helped to improve your condition.  You have a BMI of 40 or higher.  You have life-threatening health problems related to obesity. Follow these instructions at home: Eating and drinking   Follow advice from your doctor about what to eat and drink. Your doctor may tell you to: ? Limit fast food, sweets, and processed snack foods. ? Choose low-fat options. For example, choose low-fat milk instead of whole milk. ? Eat 5 or more servings of fruits or vegetables each day. ? Eat at home more often. This gives you more control over what you eat. ? Choose healthy foods when you eat out. ? Learn to read food labels. This will help you learn how much food is in 1 serving. ? Keep low-fat snacks available. ? Avoid drinks that have a lot of sugar in them. These include soda, fruit juice, iced tea with sugar, and flavored milk.  Drink enough water to keep your pee (urine) pale yellow.  Do not go on fad diets. Physical activity  Exercise often, as told by your doctor. Most adults should get up to 150 minutes of moderate-intensity exercise every week.Ask your doctor: ? What types of exercise are safe for you. ? How often you should exercise.  Warm up and stretch before being active.  Do slow stretching after being active (cool down).  Rest between times of being active. Lifestyle  Work with your doctor and a food expert (dietitian) to set a weight-loss goal that is best for you.  Limit your screen time.  Find ways to reward yourself that do not involve food.  Do not drink alcohol if: ? Your doctor tells you not to drink. ? You are pregnant, may be pregnant, or are planning to become pregnant.  If you drink  alcohol: ? Limit how much you use to:  0-1 drink a day for women.  0-2 drinks a day for men. ? Be aware of how much alcohol is in your drink. In the [place] one drink equals one 12 oz bottle of beer (355 mL), one 5 oz glass of wine (148 mL), or one 1 oz glass of hard liquor (44 mL). General instructions  Keep a weight-loss journal.  This can help you keep track of: ? The food that you eat. ? How much exercise you get.  Take over-the-counter and prescription medicines only as told by your doctor.  Take vitamins and supplements only as told by your doctor.  Think about joining a support group.  Keep all follow-up visits as told by your doctor. This is important. Contact a doctor if:  You cannot meet your weight loss goal after you have changed your diet and lifestyle for 6 weeks. Get help right away if you:  Are having trouble breathing.  Are having thoughts of harming yourself. Summary  Obesity is having too much body fat.  Being obese means that your weight is more than what is healthy for you.  Work with your doctor to set a weight-loss goal.  Get regular exercise as told by your doctor. This information is not intended to replace advice given to you by your health care provider. Make sure you discuss any questions you have with your health care provider. Document Revised: 03/11/2018 Document Reviewed: 03/11/2018 Elsevier Patient Education  2020 [employer].

## 2020-06-12 NOTE — Progress Notes (Signed)
Subjective:    Patient ID: Donald Malone, male    DOB: 1966/04/03, 54 y.o.   MRN: [id number]  This is a 54 year old male homeless gentleman [facility] the [facility] to establish with history of diabetes and hypertension.  Note on arrival hemoglobin A1c was 7.4 blood sugar 168 but blood pressure was good at 121/72.  Note patient was admitted in July with stroke syndrome and cocaine use.  The patient is no longer using cocaine no longer smoking.  He drinks occasionally.  Below is the discharge summary from the July admission    Admit date: 02/01/2020 Discharge date: 02/08/2020  Time spent: 45 minutes  Recommendations for Outpatient Follow-up:  Patient will be discharged to home/[facility].  Patient will need to follow up with primary care provider within one week of discharge.  Repeat LFTs in 2 weeks, if LFTs have normalized will need to be started on a statin medication for your cholesterol. Follow up with neurology. Patient should continue medications as prescribed.  Patient should follow a heart healthy/carb modified diet.   Discharge Diagnoses:  Acute CVA Basilar artery stenosis Cocaine abuse Diabetes mellitus, type 2 Hyperlipidemia with elevated LFTs Essential hypertension  Homeless PossibleDental caries Obesity  Discharge Condition: Stable  Diet recommendation: heart healthy/carb modified  Filed Weights  02/03/20 1700 Weight: 98.9 kg   History of present illness:  On 02/01/2020 by Dr. Marcial Pacas Opyd Donald Kern Donald Malone a 54 y.o.malewith medical history significant forCOPD, type 2 diabetes mellitus, hypertension, and homelessness, presented to the emergency department with left-sided weakness. Patient reports that he was in his usual state of health on 01/30/2020 when he noticed some weakness involving the left side. He has had persistent left arm and left leg weakness since then, had some transient blurred vision, but denies any  headache, numbness, difficulty with speech or swallowing, or fevers. He has been able to ambulate, though with some difficulty. He denies any chest pain, palpitations, cough, or shortness of breath. Patient was in the [place] [employer] and currently works for a [employer] but has been homeless for the past 2 years, living on the streets, does not have a PCP, and has not been taking any medications recently.  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  Multiple periapical abscesses of maxillary dentition -MRI brain showed right paramedian pontine infarct.  Small vessel disease.  Small right maxillary sinus mucous retention cyst. -MRA showed no LVO.  Mid BA short to moderate stenosis.  Left A2 near occlusion.  Widespread moderate small vessel disease. -CTA head showed moderate mid PA stenosis.  Other stenosis: L A2 high-grade, L P2 moderate, mid R P2 mild to moderate -CTA neck unremarkable -Echocardiogram shows EF of 50 to 55%.  No source of embolus.  LA mildly dilated. -LDL 108, hemoglobin A1c 7.8 -No antithrombotic prior to admission.  Now on aspirin 325 daily, Plavix 75 mg daily.  DAPT for 3 months and aspirin alone due to large vessel stenosis. -PT and OT recommended outpatient therapy  Basilar artery stenosis -Stroke Team suggests medical management for now with dual antiplatelet therapy -consider adding high-dose statin if LFTs normalize  Cocaine abuse -UDS positive for cocaine -patient counseled on absolute need to fully abstain from cocaine and states he is willing- denies habitual use  Diabetes mellitus, type 2 -A1c 6.26 September 2019 and 7.8  this admission  -not on any medications at presentation  -Started on Metformin  Hyperlipidemia with elevated LFTs -LDL 108 with goal of less than 70  -unable to initiate statin at present  as LFTs are mildly elevated  -viral hepatitis panel unrevealing -Will need statin once LFTs normalized  Essential hypertension  -was not on any medications prior to admission -Continue enalapril, HCTZ   Homeless Patient also does not have a PCP or access to medications -TOC consulted- medically ready for d/c as soon as a shelter can be identified   PossibleDental caries -Multiple periapical abscesses of maxillary dentitionsuggestedon CT imaging  -Since patient is aysmptomatic, no antibiotics were started. Patient to follow up with dentistry  Obesity -BMI 39.87 -Will need to see his PCP for lifestyle modifications  Patient still has a BMI of 40 is still in the [facility].  As noted he has quit smoking.  He does note some stiffness in his legs when walking.  He is working now trying to improve himself.  The patient did have a flu vaccine already October 27 he is due a pneumonia vaccine and tetanus vaccine he already had his Covid vaccine in August of this year  There are no other complaints  Past Medical History:  Diagnosis Date  . COPD (chronic obstructive pulmonary disease) (HCC)   . Diabetes mellitus without complication (HCC)   . Hypertension      History reviewed. No pertinent family history.   Social History   Socioeconomic History  . Marital status: Single    Spouse name: Not on file  . Number of children: Not on file  . Years of education: Not on file  . Highest education level: Not on file  Occupational History  . Not on file  Tobacco Use  . Smoking status: Former Smoker    Packs/day: 1.00  . Smokeless tobacco: Never Used  Substance and Sexual Activity  . Alcohol use: Not on file  . Drug use: Not on file  . Sexual activity: Not on file  Other Topics Concern  . Not on file  Social History Narrative  . Not on file   Social Determinants of Health   Financial Resource Strain:   . Difficulty of Paying Living Expenses: Not on file    Food Insecurity:   . Worried About Programme researcher, broadcasting/film/video in the Last Year: Not on file  . Ran Out of Food in the Last Year: Not on file  Transportation Needs:   . Lack of Transportation (Medical): Not on file  . Lack of Transportation (Non-Medical): Not on file  Physical Activity:   . Days of Exercise per Week: Not on file  . Minutes of Exercise per Session: Not on file  Stress:   . Feeling of Stress : Not on file  Social Connections:   . Frequency of Communication with Friends and Family: Not on file  . Frequency of Social Gatherings with Friends and Family: Not on file  . Attends Religious Services: Not on file  . Active Member of Clubs or Organizations: Not on file  . Attends Banker Meetings: Not on file  . Marital Status: Not on file  Intimate Partner Violence:   . Fear of Current or Ex-Partner: Not on file  . Emotionally Abused: Not on file  . Physically Abused: Not on file  . Sexually Abused: Not on file     No Known Allergies   Outpatient Medications Prior to Visit  Medication Sig  Dispense Refill  . aspirin 325 MG EC tablet Take 1 tablet (325 mg total) by mouth daily. 100 tablet 1  . amLODipine (NORVASC) 10 MG tablet Take 1 tablet (10 mg total) by mouth daily. 90 tablet 3  . clopidogrel (PLAVIX) 75 MG tablet Take 1 tablet (75 mg total) by mouth daily. 45 tablet 2  . hydrochlorothiazide (HYDRODIURIL) 25 MG tablet Take 1 tablet (25 mg total) by mouth daily. 90 tablet 3  . lisinopril (ZESTRIL) 40 MG tablet Take 1 tablet (40 mg total) by mouth daily. 90 tablet 0  . metFORMIN (GLUCOPHAGE) 500 MG tablet Take 1 tablet (500 mg total) by mouth 2 (two) times daily with a meal. 120 tablet 0   No facility-administered medications prior to visit.      Review of Systems  Constitutional: Negative.   HENT: Positive for dental problem. Negative for rhinorrhea, sinus pressure, sinus pain, sneezing, sore throat, tinnitus and trouble swallowing.   Eyes: Negative.    Respiratory: Negative.   Cardiovascular: Negative.   Gastrointestinal: Negative.   Endocrine: Negative.   Genitourinary: Negative.   Musculoskeletal: Negative.   Neurological: Negative.   Psychiatric/Behavioral: Negative.        Objective:   Physical Exam Vitals:   06/12/20 1011  BP: 121/72  Pulse: 95  SpO2: 94%  Weight: 220 lb 4.8 oz (99.9 kg)    Gen: Pleasant, obese in no distress,  normal affect  ENT: No lesions,  mouth clear,  oropharynx clear, no postnasal drip, poor dentition with dental caries  Neck: No JVD, no TMG, no carotid bruits  Lungs: No use of accessory muscles, no dullness to percussion, clear without rales or rhonchi  Cardiovascular: RRR, heart sounds normal, no murmur or gallops, no peripheral edema  Abdomen: soft and NT, no HSM,  BS normal  Musculoskeletal: No deformities, no cyanosis or clubbing  Neuro: alert, non focal  Skin: Warm, no lesions or rashes  No results found.  Foot exam normal      Assessment & Plan:  I personally reviewed all images and lab data [facility] as well as any outside material available during this office visit and agree with the  radiology impressions.   Hypertension Hypertension well controlled continue amlodipine and lisinopril as prescribed  Neurologic deficit due to acute ischemic cerebrovascular accident (CVA) (HCC) History of pontine stroke with negative otherwise work-up  Discontinue clopidogrel after current bottle runs out and continue aspirin 325 mg daily  Follow-up lipid panel follow-up liver function liver function normal will need to prescribe statin therapy  COPD (chronic obstructive pulmonary disease) (HCC) Monitor off tobacco use  Diabetes mellitus without complication (HCC) Type 2 diabetes not yet well controlled  Increase Metformin to 1000 mg twice daily begin Trulicity 75 mcg weekly  Monitor response obtain microalbumin creatinine urine ratio     Donald LongsJoseph was seen today for  follow-up.  Diagnoses and all orders for this visit:  Diabetes mellitus without complication (HCC) -     POCT glucose (manual entry) -     POCT glycosylated hemoglobin (Hb A1C) -     Comprehensive metabolic panel -     Microalbumin / creatinine urine ratio  Primary hypertension -     CBC with Differential/Platelet  Colon cancer screening -     Fecal occult blood, imunochemical  Neurologic deficit due to acute ischemic cerebrovascular accident (CVA) (HCC)  Chronic obstructive pulmonary disease, unspecified COPD type (HCC)  Other orders -     Dulaglutide (TRULICITY)  0.75 MG/0.5ML SOPN; Inject 0.75 mg into the skin once a week. -     clopidogrel (PLAVIX) 75 MG tablet; One daily, stop after current bottle runs out -     metFORMIN (GLUCOPHAGE) 500 MG tablet; Take 2 tablets (1,000 mg total) by mouth 2 (two) times daily with a meal. -     lisinopril (ZESTRIL) 40 MG tablet; Take 1 tablet (40 mg total) by mouth daily. -     hydrochlorothiazide (HYDRODIURIL) 25 MG tablet; Take 1 tablet (25 mg total) by mouth daily. -     amLODipine (NORVASC) 10 MG tablet; Take 1 tablet (10 mg total) by mouth daily.   Administer Tdap and Pneumovax  Obtain fecal occult study for colon cancer screening  Obtain for patient financial assistance

## 2020-06-12 NOTE — Assessment & Plan Note (Signed)
History of pontine stroke with negative otherwise work-up  Discontinue clopidogrel after current bottle runs out and continue aspirin 325 mg daily  Follow-up lipid panel follow-up liver function liver function normal will need to prescribe statin therapy

## 2020-06-13 ENCOUNTER — Telehealth: Payer: Self-pay

## 2020-06-13 LAB — CBC WITH DIFFERENTIAL/PLATELET
Basophils Absolute: 0 10*3/uL (ref 0.0–0.2)
Basos: 0 %
EOS (ABSOLUTE): 0.1 10*3/uL (ref 0.0–0.4)
Eos: 3 %
Hematocrit: 42.6 % (ref 37.5–51.0)
Hemoglobin: 14.5 g/dL (ref 13.0–17.7)
Immature Grans (Abs): 0 10*3/uL (ref 0.0–0.1)
Immature Granulocytes: 0 %
Lymphocytes Absolute: 1.2 10*3/uL (ref 0.7–3.1)
Lymphs: 26 %
MCH: 28.2 pg (ref 26.6–33.0)
MCHC: 34 g/dL (ref 31.5–35.7)
MCV: 83 fL (ref 79–97)
Monocytes Absolute: 0.4 10*3/uL (ref 0.1–0.9)
Monocytes: 9 %
Neutrophils Absolute: 3 10*3/uL (ref 1.4–7.0)
Neutrophils: 62 %
Platelets: 220 10*3/uL (ref 150–450)
RBC: 5.14 x10E6/uL (ref 4.14–5.80)
RDW: 13.1 % (ref 11.6–15.4)
WBC: 4.9 10*3/uL (ref 3.4–10.8)

## 2020-06-13 LAB — MICROALBUMIN / CREATININE URINE RATIO
Creatinine, Urine: 59.1 mg/dL
Microalb/Creat Ratio: 155 mg/g creat — ABNORMAL HIGH (ref 0–29)
Microalbumin, Urine: 91.4 ug/mL

## 2020-06-13 LAB — COMPREHENSIVE METABOLIC PANEL
ALT: 44 IU/L (ref 0–44)
AST: 31 IU/L (ref 0–40)
Albumin/Globulin Ratio: 1.6 (ref 1.2–2.2)
Albumin: 4.5 g/dL (ref 3.8–4.9)
Alkaline Phosphatase: 72 IU/L (ref 44–121)
BUN/Creatinine Ratio: 13 (ref 9–20)
BUN: 14 mg/dL (ref 6–24)
Bilirubin Total: <0.2 mg/dL (ref 0.0–1.2)
CO2: 25 mmol/L (ref 20–29)
Calcium: 9.4 mg/dL (ref 8.7–10.2)
Chloride: 106 mmol/L (ref 96–106)
Creatinine, Ser: 1.06 mg/dL (ref 0.76–1.27)
GFR calc Af Amer: 93 mL/min/{1.73_m2} (ref >59)
GFR calc non Af Amer: 80 mL/min/{1.73_m2} (ref >59)
Globulin, Total: 2.8 g/dL (ref 1.5–4.5)
Glucose: 126 mg/dL — ABNORMAL HIGH (ref 65–99)
Potassium: 4.5 mmol/L (ref 3.5–5.2)
Sodium: 144 mmol/L (ref 134–144)
Total Protein: 7.3 g/dL (ref 6.0–8.5)

## 2020-06-13 NOTE — Telephone Encounter (Signed)
Att to contact pt about lab results. Phone number on file invalid#  If pt calls requesting lab results., Blood count normal..A1C level 7.4 Glucose 168..ok to give lab results

## 2020-06-20 ENCOUNTER — Encounter: Payer: Self-pay | Admitting: Critical Care Medicine

## 2020-06-20 NOTE — Progress Notes (Signed)
Patient ID: Donald Malone, male   DOB: 1966-05-17, 54 y.o.   MRN: [id number] This is a 54 year old male seen in the [place] shelter clinic for follow-up of hypertension diabetes and discuss his financial assistance process  Patient is compliant with all his medications  On exam blood pressure 130/60 pulse 100 saturation 95% room air blood sugar is 223 patient is just eaten  Exam is unchanged  Pression is type 2 diabetes not well controlled  Hypertension well controlled  Plans for the patient to maintain current medication profile and to watch his diet more's closely  I went over with the patient the [place] financial process and connected him to our financial planner

## 2020-06-27 ENCOUNTER — Encounter: Payer: Self-pay | Admitting: Critical Care Medicine

## 2020-06-27 NOTE — Progress Notes (Signed)
Patient ID: Donald Malone, male   DOB: 02-23-1966, 54 y.o.   MRN: [id number] This is a 54 year old male seen in the [facility] shelter clinic for blood pressure check  Blood pressure is 138/100 pulse 92 saturation 96% room air  The patient did miss his blood pressure medicines this morning  Patient is on the amlodipine 10 mg daily hydrocal thiazide 25 mg daily lisinopril 40 mg daily Metformin 1000 mg twice daily Plavix once daily aspirin once daily  The patient has a financial assistance appointment set up for December 13 he knows the date and time  Nothing else was required at this visit I encouraged him to be more compliant with his blood pressure medications

## 2020-07-02 ENCOUNTER — Other Ambulatory Visit: Payer: Self-pay

## 2020-07-02 ENCOUNTER — Ambulatory Visit: Payer: 59 | Attending: Critical Care Medicine

## 2020-07-04 ENCOUNTER — Other Ambulatory Visit: Payer: Self-pay | Admitting: Critical Care Medicine

## 2020-07-04 ENCOUNTER — Other Ambulatory Visit (HOSPITAL_COMMUNITY): Payer: Self-pay | Admitting: Critical Care Medicine

## 2020-07-04 ENCOUNTER — Encounter: Payer: Self-pay | Admitting: Critical Care Medicine

## 2020-07-04 LAB — FECAL OCCULT BLOOD, IMMUNOCHEMICAL: Fecal Occult Bld: NEGATIVE

## 2020-07-04 MED ORDER — VALSARTAN-HYDROCHLOROTHIAZIDE 160-25 MG PO TABS: 1.0000 | ORAL_TABLET | Freq: Every day | ORAL | 1 refills | Status: DC

## 2020-07-04 MED ORDER — CLOPIDOGREL BISULFATE 75 MG PO TABS: ORAL_TABLET | ORAL | 2 refills | Status: DC

## 2020-07-04 MED ORDER — METFORMIN HCL 500 MG PO TABS: 1000.0000 mg | ORAL_TABLET | Freq: Two times a day (BID) | ORAL | 1 refills | Status: DC

## 2020-07-04 MED ORDER — AMLODIPINE BESYLATE 10 MG PO TABS: 10.0000 mg | ORAL_TABLET | Freq: Every day | ORAL | 3 refills | Status: DC

## 2020-07-04 MED ORDER — ASPIRIN 325 MG PO TBEC
325.0000 mg | DELAYED_RELEASE_TABLET | Freq: Every day | ORAL | 1 refills | Status: DC
Start: 2020-07-04 — End: 2020-07-25

## 2020-07-04 MED FILL — METFORMIN HCL 500 MG TABS: 500 | 30 days supply | Qty: 120 | Fill #0

## 2020-07-04 MED FILL — AMLODIPINE BESYLATE 10 MG T: 10 | 90 days supply | Qty: 90 | Fill #0

## 2020-07-04 MED FILL — VALSARTAN-HCTZ 160-25 MG TA: 160-25 | 30 days supply | Qty: 30 | Fill #0

## 2020-07-04 NOTE — Progress Notes (Signed)
Patient ID: Donald Malone, male   DOB: 1965-11-29, 54 y.o.   MRN: [id number] This patient is seen in the [facility] in return follow-up with diabetes and hypertension.  He has run out of his Plavix and nearly out of all his other medications.  He was supposed of had these refilled but he did not stay to pick up his medicines when he last saw me recently.   On exam blood pressure 152/90 pulse 76 saturation 99% room air  Physical exam is largely unremarkable  The patient will have to have all of his medicines refilled through the Congregational nurse find. The patient has failed to follow through on his orange and blue card applications he is missing key documents he got frustrated with the process and quit the process  Note his rectal fecal occult study was negative for cancer   My plan is to discontinue lisinopril and hydrochlorthiazide and begin valsartan HCT in place of this and continue the amlodipine

## 2020-07-10 ENCOUNTER — Encounter: Payer: Self-pay | Admitting: *Deleted

## 2020-07-12 ENCOUNTER — Telehealth: Payer: Self-pay

## 2020-07-12 NOTE — Telephone Encounter (Signed)
As per Butch Penny, RPH the patient's drug coverage ended 07/09/2020.  Without drug coverage, he could be eligible for patient assistance /DOH programs at [facility]  Attempted to contact the patient to inquire if he has another Financial controller.  Call placed to # [phone] twice and the phone just rings, no option to leave a message

## 2020-07-25 ENCOUNTER — Encounter: Payer: Self-pay | Admitting: Critical Care Medicine

## 2020-07-25 ENCOUNTER — Other Ambulatory Visit: Payer: Self-pay | Admitting: Critical Care Medicine

## 2020-07-25 MED ORDER — ASPIRIN 325 MG PO TBEC: 325.0000 mg | DELAYED_RELEASE_TABLET | Freq: Every day | ORAL | 1 refills | Status: DC

## 2020-07-25 MED ORDER — TRULICITY 0.75 MG/0.5ML ~~LOC~~ SOAJ: 0.7500 mg | SUBCUTANEOUS | 4 refills | Status: DC

## 2020-07-25 MED ORDER — NICOTINE 7 MG/24HR TD PT24: 7.0000 mg | MEDICATED_PATCH | Freq: Every day | TRANSDERMAL | 0 refills | Status: DC

## 2020-07-25 NOTE — Progress Notes (Signed)
Patient ID: Donald Malone, male   DOB: 07-Nov-1965, 55 y.o.   MRN: [id number] This patient is seen today in the [place] shelter clinic he is wishing to have a nicotine patch as he still smoking intermittently he is looking for a Covid booster and we gave him directions as we can get 1 on arrival blood pressure is 144/78 saturations 98% room air blood glucose is 237 he is maintained on the Metformin is yet to get his Trulicity as prescribed  On exam abdomen is protuberant chest is clear card exam is unremarkable  Impression is that of tobacco use will obtain nicotine patch Type 2 diabetes begin Trulicity 75 mcg weekly continue Metformin No. 3 hypertension continue current medications

## 2020-07-25 NOTE — Progress Notes (Signed)
Med refills

## 2020-07-26 MED FILL — !TRULICITY 0.75 MG/0.5 ML P: 0.75 | 28 days supply | Qty: 2 | Fill #0

## 2020-07-26 MED FILL — NICOTINE 7 MG/24HR PATCH: 7 | 28 days supply | Qty: 28 | Fill #0

## 2020-08-01 ENCOUNTER — Encounter: Payer: Self-pay | Admitting: Critical Care Medicine

## 2020-08-01 NOTE — Progress Notes (Signed)
Patient ID: Donald Malone, male   DOB: [birth year], 55 y.o.   MRN: [id number] Is a 55 year old male seen today with history of hypertension and diabetes at the [place] shelter clinic  He wanted to discuss his schooling and lack of funding for tuition  The patient also was given a sample of aspirin 3 and 25 mg which he is to take daily and we discussed that he has a prescription for nicotine patch and Trulicity at the clinic we will endeavor to pick this up and deliver it to him in the morning

## 2020-08-15 ENCOUNTER — Encounter: Payer: Self-pay | Admitting: Critical Care Medicine

## 2020-08-15 ENCOUNTER — Other Ambulatory Visit: Payer: Self-pay | Admitting: Critical Care Medicine

## 2020-08-15 ENCOUNTER — Other Ambulatory Visit (HOSPITAL_COMMUNITY): Payer: Self-pay | Admitting: Critical Care Medicine

## 2020-08-15 MED ORDER — TRUE METRIX BLOOD GLUCOSE TEST VI STRP: ORAL_STRIP | 12 refills | Status: DC

## 2020-08-15 MED ORDER — VALSARTAN-HYDROCHLOROTHIAZIDE 160-25 MG PO TABS: 1.0000 | ORAL_TABLET | Freq: Every day | ORAL | 1 refills | Status: DC

## 2020-08-15 MED ORDER — TRUE METRIX METER W/DEVICE KIT: PACK | 0 refills | Status: DC

## 2020-08-15 MED ORDER — TRUEPLUS LANCETS 28G MISC
1 refills | Status: DC
Start: 2020-08-15 — End: 2021-01-09

## 2020-08-15 MED ORDER — CLOPIDOGREL BISULFATE 75 MG PO TABS: ORAL_TABLET | ORAL | 2 refills | Status: DC

## 2020-08-15 MED ORDER — AMLODIPINE BESYLATE 10 MG PO TABS: 10.0000 mg | ORAL_TABLET | Freq: Every day | ORAL | 3 refills | Status: DC

## 2020-08-15 MED ORDER — METFORMIN HCL 500 MG PO TABS: 1000.0000 mg | ORAL_TABLET | Freq: Two times a day (BID) | ORAL | 1 refills | Status: DC

## 2020-08-15 MED FILL — VALSARTAN-HCTZ 160-25 MG TA: 160-25 | 30 days supply | Qty: 30 | Fill #0

## 2020-08-15 MED FILL — METFORMIN HCL 500 MG TABS: 500 | 30 days supply | Qty: 120 | Fill #0

## 2020-08-15 MED FILL — TRUE METRIX TEST STRIP: 25 days supply | Qty: 50 | Fill #0

## 2020-08-15 MED FILL — TRUEplus LANCETS 28G MISC: 30 days supply | Qty: 100 | Fill #0

## 2020-08-15 MED FILL — TRUE METRIX BLOOD GLUCOSE M: W/DEVICE | 1 days supply | Qty: 1 | Fill #0

## 2020-08-16 ENCOUNTER — Encounter: Payer: Self-pay | Admitting: Pediatric Intensive Care

## 2020-08-16 DIAGNOSIS — E119 Type 2 diabetes mellitus without complications: Secondary | ICD-10-CM

## 2020-08-16 LAB — GLUCOSE, POCT (MANUAL RESULT ENTRY): POC Glucose: 224 mg/dl — AB (ref 70–99)

## 2020-08-16 MED FILL — CLOPIDOGREL 75 MG TABLET: 75 | 30 days supply | Qty: 30 | Fill #1

## 2020-08-16 NOTE — Congregational Nurse Program (Signed)
  Dept: [phone]   Congregational Nurse Program Note  Date of Encounter: 08/16/2020  Past Medical History: Past Medical History:  Diagnosis Date  . COPD (chronic obstructive pulmonary disease) (HCC)   . Diabetes mellitus without complication (HCC)   . Hypertension     Encounter Details:  CNP Questionnaire - 08/16/20 1117      Questionnaire   Do you give verbal consent to treat you today? Yes    Visit Setting Church or Organization    Location Patient Served At [facility]    Patient Status Homeless    Medical Provider Yes    Insurance Uninsured (Includes Orange Card/Care Connects)    Intervention Educate;Assess (including screenings)    Housing/Utilities No permanent housing    Medication Provided medication assistance [employer], drug rep, etc.)    Referrals Medication Assistance         medication hand off with Marin Roberts RN at [facility] clinic. Client has not questions about medication except states that he has been taking 1000mg  of metformin q am. CN advised client to take 500mg  morning and  Evening. CN assisted client with glucometer set up. 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 RN BSN CNP [phone]

## 2020-08-16 NOTE — Progress Notes (Signed)
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

## 2020-08-22 ENCOUNTER — Encounter: Payer: Self-pay | Admitting: Physician Assistant

## 2020-08-22 NOTE — Progress Notes (Signed)
Pt is working hard at [employer], appreciates the income.   He has a plan to go next week to [facility] and pay his last fine.  He is trying to take care of himself. Tries to avoid white carbs.  He will eat salads when available.  CBG 127  Gave himself the Trulicity, no problems  Really looking forward to making progress in his life goals.  Not much to drink today and yesterday, may account for the BP a little lower and the HR a little higher than normal for him.  Meds to refill: valsartan-HCTZ  Theodore Demark, PA-C 08/22/2020 2:40 PM

## 2020-09-05 ENCOUNTER — Encounter: Payer: Self-pay | Admitting: Physician Assistant

## 2020-09-06 NOTE — Progress Notes (Signed)
Donald Malone comes in today complaining of tooth pain.  He had his teeth sensitivity on the left which is new.    He remembers that he had abscesses on CT last July at the time of his stroke.  The report states there are multiple periapical abscesses involving the visualized maxillary dentition.  He has not had any fevers he has not had any jaw swelling.  Having a tooth ache, but he gets pain when eating or drinking something cold.  His teeth were examined, dentition is generally poor but his upper back tooth has multiple cavities.  There is no significant swelling or erythema.  There is no tenderness in the mucosa except at the tooth itself.  He was given hydrogen peroxide and instructions on using it as a rinse.  3% solution is diluted 2-1, swish for 60 seconds.  The need to make sure he does NOT swallow it was emphasized.  Theodore Demark, PA-C 09/06/2020 12:48 PM

## 2020-09-12 ENCOUNTER — Encounter: Payer: Self-pay | Admitting: *Deleted

## 2020-09-12 ENCOUNTER — Other Ambulatory Visit: Payer: Self-pay | Admitting: Critical Care Medicine

## 2020-09-12 ENCOUNTER — Encounter: Payer: Self-pay | Admitting: Critical Care Medicine

## 2020-09-12 MED ORDER — TRULICITY 0.75 MG/0.5ML ~~LOC~~ SOAJ
0.7500 mg | SUBCUTANEOUS | 4 refills | Status: DC
Start: 2020-09-12 — End: 2020-10-03

## 2020-09-12 MED FILL — METFORMIN HCL 500 MG TABS: 500 | 30 days supply | Qty: 120 | Fill #1

## 2020-09-12 MED FILL — $TRULICITY 0.75 MG/0.5 ML P: 0.75 | 28 days supply | Qty: 2 | Fill #0

## 2020-09-12 MED FILL — CLOPIDOGREL 75 MG TABLET: 75 | 30 days supply | Qty: 30 | Fill #2

## 2020-09-12 MED FILL — VALSARTAN-HCTZ 160-25 MG TA: 160-25 | 30 days supply | Qty: 30 | Fill #1

## 2020-09-12 NOTE — Congregational Nurse Program (Unsigned)
  Dept: [phone]   Congregational Nurse Program Note  Date of Encounter: 09/12/2020  Past Medical History: Past Medical History:  Diagnosis Date  . COPD (chronic obstructive pulmonary disease) (HCC)   . Diabetes mellitus without complication (HCC)   . Hypertension     Encounter Details: pt comes in asking for refills, trulicity he is out and was told by [facility], trulicity does not fall into programof congregational nurses. Will send to dr Delford Field and will stop at [facility] pharm to check on med

## 2020-09-13 NOTE — Progress Notes (Signed)
Patient ID: Donald Malone, male   DOB: [birth year], 55 y.o.   MRN: [id number] Is a 55 year old male seen today for follow-up of hypertension and diabetes.  He has run out of his Trulicity and I think has been not taking all of his medicines regularly he also is been drinking more excessively  On exam blood pressure 160/90 pulse 103 glucose 227  Chest is clear card exam is unremarkable  Impression is diabetes not well controlled as he is run out of Trulicity and hypertension due to medication nonadherence  Also history of stroke on Plavix which she is due to stop  Plan is for the patient to stop Plavix once he runs out of the current supply  Patient will have refills on Trulicity and is encouraged to resume his Metformin and blood pressure medications and to reduce and eliminate alcohol consumption

## 2020-09-19 ENCOUNTER — Encounter: Payer: Self-pay | Admitting: *Deleted

## 2020-09-19 ENCOUNTER — Encounter: Payer: Self-pay | Admitting: Physician Assistant

## 2020-09-19 NOTE — Congregational Nurse Program (Unsigned)
  Dept: [phone]   Congregational Nurse Program Note  Date of Encounter: 09/19/2020  Past Medical History: Past Medical History:  Diagnosis Date  . COPD (chronic obstructive pulmonary disease) (HCC)   . Diabetes mellitus without complication (HCC)   . Hypertension     Encounter Details: delevered med refills of clopidogrel, metformin and valsartan-hctz. reviewed

## 2020-09-19 NOTE — Progress Notes (Signed)
Donald Malone has not had a drink in a couple of days. Says he has quit.  Says has taken all his meds.  Is anxious about his situation, having trouble getting his money together.  Had a pork chop and several Dove hearts, CBG now 321 on our machine, 296 on his machine. He does not ck CBGs.  Is encouraged to check CBG bid and document results >> was given a card to do so.  Initial BP 182/84, repeat 162/84 HR 108. Says has taken his meds today.   Has swollen lymph node under L ear and L side of his face is swollen. No fevers, tooth no longer hurts. L ear w/ some wax but no S&S infection, no erythema or drainage. Not clear if ABX needed.  Theodore Demark, PA-C 09/19/2020 4:07 PM

## 2020-09-19 NOTE — Congregational Nurse Program (Unsigned)
cbg 321 Will continue to check cbg, gave pt a card to record cbg values.

## 2020-09-20 ENCOUNTER — Encounter: Payer: Self-pay | Admitting: *Deleted

## 2020-09-20 NOTE — Congregational Nurse Program (Signed)
  Dept: [phone]   Congregational Nurse Program Note  Date of Encounter: 09/20/2020  Past Medical History: Past Medical History:  Diagnosis Date  . COPD (chronic obstructive pulmonary disease) (HCC)   . Diabetes mellitus without complication (HCC)   . Hypertension     Encounter Details:spoke to pt about what was discussed 3/2 process of checking cbg and taking meds. He stated he does not remember any of what was discussed in clinic. He was remended about using his cbg meter, stated he doesn't have one, was informed of visit and reminded of meter values. He went back to dorm and did retrieve a meter and stated he had forgotten everything discussed in clinic??? Went over meds and when to take, went over when to check cbg. Reminded of cbg log prepared for him. He states he has it now and will try to do better.

## 2020-10-03 ENCOUNTER — Other Ambulatory Visit: Payer: Self-pay | Admitting: Critical Care Medicine

## 2020-10-03 ENCOUNTER — Encounter: Payer: Self-pay | Admitting: Critical Care Medicine

## 2020-10-03 MED ORDER — TRULICITY 0.75 MG/0.5ML ~~LOC~~ SOAJ: 0.7500 mg | SUBCUTANEOUS | 4 refills | Status: DC

## 2020-10-03 NOTE — Progress Notes (Signed)
This 55 year old male seen today in the [place] shelter clinic on arrival blood sugar is 160.  He is here to have his blood pressure sugar checked.  He reviewed with [place] his current progress he is looking to get housing and to enter vocational rehab  We gave him encouragement to continue this plan and maintain his current medications

## 2020-10-05 MED FILL — $TRULICITY 0.75 MG/0.5 ML P: 0.75 | 28 days supply | Qty: 2 | Fill #1

## 2020-10-10 ENCOUNTER — Other Ambulatory Visit: Payer: Self-pay | Admitting: Critical Care Medicine

## 2020-10-10 ENCOUNTER — Encounter: Payer: Self-pay | Admitting: Critical Care Medicine

## 2020-10-10 MED ORDER — VALSARTAN-HYDROCHLOROTHIAZIDE 320-25 MG PO TABS
1.0000 | ORAL_TABLET | Freq: Every day | ORAL | 1 refills | Status: DC
Start: 2020-10-10 — End: 2021-01-09

## 2020-10-10 NOTE — Progress Notes (Signed)
This patient was seen [facility] for diabetes hypertension on arrival glucose 248 blood pressure 160/90 saturation 99% room air pulse 97 patient is on a lower dose of valsartan HCT.  The patient does not have any other complaints  Impression is that of hypertension poorly controlled diabetes poorly controlled  Plan is for the patient to continue Trulicity and other current medications for diabetes  Patient will increase valsartan HCT 320/25 daily

## 2020-10-26 ENCOUNTER — Other Ambulatory Visit (HOSPITAL_COMMUNITY): Payer: Self-pay

## 2020-10-31 ENCOUNTER — Encounter: Payer: Self-pay | Admitting: Critical Care Medicine

## 2020-10-31 ENCOUNTER — Other Ambulatory Visit: Payer: Self-pay | Admitting: *Deleted

## 2020-10-31 ENCOUNTER — Other Ambulatory Visit: Payer: Self-pay | Admitting: Critical Care Medicine

## 2020-10-31 MED ORDER — ASPIRIN EC 325 MG PO TBEC: DELAYED_RELEASE_TABLET | Freq: Every day | ORAL | 1 refills | Status: DC

## 2020-10-31 MED ORDER — VALSARTAN-HYDROCHLOROTHIAZIDE 160-25 MG PO TABS: 1.0000 | ORAL_TABLET | Freq: Every day | ORAL | 1 refills | Status: DC

## 2020-10-31 MED ORDER — METFORMIN HCL 500 MG PO TABS: ORAL_TABLET | Freq: Two times a day (BID) | ORAL | 1 refills | Status: DC

## 2020-10-31 MED ORDER — DULAGLUTIDE 0.75 MG/0.5ML ~~LOC~~ SOAJ: 0.7500 mg | SUBCUTANEOUS | 4 refills | Status: DC

## 2020-10-31 MED ORDER — CLOPIDOGREL BISULFATE 75 MG PO TABS: ORAL_TABLET | Freq: Every day | ORAL | 1 refills | Status: DC

## 2020-10-31 MED ORDER — AMLODIPINE BESYLATE 10 MG PO TABS: ORAL_TABLET | Freq: Every day | ORAL | 1 refills | Status: DC

## 2020-11-02 NOTE — Progress Notes (Signed)
Met briefly with the patient.  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  BP good.  CBG 183  Pt compliant with all meds  Refills sent to his pharmacy  T2DM ,HTN, Morbid obesity, hx ETOH abuse,   Cont valsartan HCT is working

## 2020-11-09 ENCOUNTER — Encounter: Payer: Self-pay | Admitting: Physician Assistant

## 2020-11-09 NOTE — Progress Notes (Signed)
PCP: Storm Frisk, MD  Mr Donald Malone stopped by to talk about what is going on with him  He also went into his history back before he went to prison.  He is working evenings at [employer]  He feels well.  He hopes to get more info from the [facility] soon.  No other issues or concerns  Theodore Demark, PA-C

## 2020-11-13 ENCOUNTER — Other Ambulatory Visit: Payer: Self-pay

## 2020-11-13 ENCOUNTER — Encounter (HOSPITAL_COMMUNITY): Payer: Self-pay | Admitting: Emergency Medicine

## 2020-11-13 ENCOUNTER — Emergency Department (HOSPITAL_COMMUNITY)
Admission: EM | Admit: 2020-11-13 | Discharge: 2020-11-13 | Disposition: A | Discharge status: Home / Self Care | Payer: 59 | Attending: Emergency Medicine | Admitting: Emergency Medicine

## 2020-11-13 DIAGNOSIS — R030 Elevated blood-pressure reading, without diagnosis of hypertension: Secondary | ICD-10-CM

## 2020-11-13 DIAGNOSIS — Z1152 Encounter for screening for COVID-19: Secondary | ICD-10-CM | POA: Insufficient documentation

## 2020-11-13 DIAGNOSIS — Z7984 Long term (current) use of oral hypoglycemic drugs: Secondary | ICD-10-CM | POA: Diagnosis not present

## 2020-11-13 DIAGNOSIS — I10 Essential (primary) hypertension: Secondary | ICD-10-CM | POA: Diagnosis not present

## 2020-11-13 DIAGNOSIS — Z8616 Personal history of COVID-19: Secondary | ICD-10-CM | POA: Diagnosis not present

## 2020-11-13 DIAGNOSIS — Z87891 Personal history of nicotine dependence: Secondary | ICD-10-CM | POA: Diagnosis not present

## 2020-11-13 DIAGNOSIS — J449 Chronic obstructive pulmonary disease, unspecified: Secondary | ICD-10-CM | POA: Diagnosis not present

## 2020-11-13 DIAGNOSIS — E119 Type 2 diabetes mellitus without complications: Secondary | ICD-10-CM | POA: Diagnosis not present

## 2020-11-13 DIAGNOSIS — Z79899 Other long term (current) drug therapy: Secondary | ICD-10-CM | POA: Insufficient documentation

## 2020-11-13 DIAGNOSIS — Z7982 Long term (current) use of aspirin: Secondary | ICD-10-CM | POA: Insufficient documentation

## 2020-11-13 DIAGNOSIS — Z20822 Contact with and (suspected) exposure to covid-19: Secondary | ICD-10-CM | POA: Diagnosis not present

## 2020-11-13 LAB — SARS CORONAVIRUS 2 (TAT 6-24 HRS): SARS Coronavirus 2: NEGATIVE

## 2020-11-13 NOTE — ED Triage Notes (Signed)
Pt coming from home, complaint of covid exposure at work.

## 2020-11-13 NOTE — Discharge Instructions (Addendum)
At this time there does not appear to be the presence of an emergent medical condition, however there is always the potential for conditions to change. Please read and follow the below instructions.  Please return to the Emergency Department immediately for any new or worsening symptoms. Please be sure to follow up with your Primary Care Provider within one week regarding your visit today; please call their office to schedule an appointment even if you are feeling better for a follow-up visit. Your COVID test will result in the next 1-2 days.  Please check your MyChart account online to review your test results.  Please call your primary care provider after you receive your results and discuss it with them.  If your COVID test is positive you will need to isolate for the next 10 days.  If your test is negative it may have been too early for you to get a COVID test so please speak with your primary care provider about getting a second COVID test in the next few days before returning to work. Please attempt to stop smoking as it is bad for your health. Your blood pressure was slightly elevated in the ER today.  Please have it rechecked by your primary care provider next week and discuss medication management with them at that time.  Please be sure to take all of your daily medications as prescribed by your primary care provider.  Go to the nearest Emergency Department immediately if: You have fever or chills Get a very bad headache. Start to feel mixed up (confused). Feel weak or numb. Feel faint. Have very bad pain in your: Chest. Belly (abdomen). Throw up more than once. Have trouble breathing. You have any new/concerning or worsening of symptoms.    Please read the additional information packets attached to your discharge summary.  Do not take your medicine if  develop an itchy rash, swelling in your mouth or lips, or difficulty breathing; call 911 and seek immediate emergency medical  attention if this occurs.  You may review your lab tests and imaging results in their entirety on your MyChart account.  Please discuss all results of fully with your primary care provider and other specialist at your follow-up visit.  Note: Portions of this text may have been transcribed using voice recognition software. Every effort was made to ensure accuracy; however, inadvertent computerized transcription errors may still be present.

## 2020-11-13 NOTE — ED Provider Notes (Signed)
[facility] EMERGENCY DEPARTMENT Provider Note   CSN: [id number] Arrival date & time: 11/13/20  0945     History Chief Complaint  Patient presents with  . Covid Exposure    Donald Malone is a 55 y.o. male history COPD, diabetes, hypertension, CVA, obesity.  Patient presents today for a COVID test.  Patient reports that he works next to someone that has recently been diagnosed with COVID-19, he last worked with them 2 days ago.  Patient reports that he has not yet developed any symptoms of COVID but wanted a test to be sure that he does not have it.  Patient reports that he received 1 dose of [place] in July 2021.  Patient denies fever/chills, sore throat, rhinorrhea, neck stiffness, chest pain/shortness of breath, cough, abdominal pain, nausea/vomiting, diarrhea, body aches or any additional concerns. HPI     Past Medical History:  Diagnosis Date  . COPD (chronic obstructive pulmonary disease) ([place])   . Diabetes mellitus without complication ([place])   . Hypertension     Patient Active Problem List   Diagnosis Date Noted  . Ischemic cerebrovascular accident (CVA) ([place]) 02/02/2020  . Neurologic deficit due to acute ischemic cerebrovascular accident (CVA) ([place]) 02/01/2020  . COPD (chronic obstructive pulmonary disease) ([place])   . Diabetes mellitus without complication ([place])   . Hypertension     History reviewed. No pertinent surgical history.     No family history on file.  Social History   Tobacco Use  . Smoking status: Former Smoker    Packs/day: 1.00  . Smokeless tobacco: Never Used    Home Medications Prior to Admission medications   Medication Sig Start Date End Date Taking? Authorizing Provider  amLODipine (NORVASC) 10 MG tablet Take 1 tablet (10 mg total) by mouth daily. 08/15/20   Elsie Stain, MD  amLODipine (NORVASC) 10 MG tablet TAKE 1 TABLET (10 MG TOTAL) BY MOUTH DAILY. 10/31/20 10/31/21  Elsie Stain, MD  aspirin 325 MG EC tablet Take 1 tablet (325 mg total) by mouth daily. 07/25/20   Elsie Stain, MD  aspirin EC 325 MG tablet TAKE 1 TABLET (325 MG TOTAL) BY MOUTH DAILY. 10/31/20 10/31/21  Elsie Stain, MD  Blood Glucose Monitoring Suppl (TRUE METRIX METER) w/Device KIT Use to measure blood sugar twice a day 08/15/20   Elsie Stain, MD  Blood Glucose Monitoring Suppl (TRUE METRIX METER) w/Device KIT USE TO MEASURE BLOOD SUGAR TWICE A DAY 08/15/20 08/15/21  Elsie Stain, MD  clopidogrel (PLAVIX) 75 MG tablet TAKE 1 TABLET (75 MG TOTAL) BY MOUTH DAILY. 10/31/20 10/31/21  Elsie Stain, MD  Dulaglutide (TRULICITY) 0.27 OZ/3.6UY SOPN Inject 0.75 mg into the skin once a week. 10/03/20   Elsie Stain, MD  Dulaglutide 0.75 MG/0.5ML SOPN INJECT 0.75 MG INTO THE SKIN ONCE A WEEK. 10/31/20 10/31/21  Elsie Stain, MD  glucose blood (TRUE METRIX BLOOD GLUCOSE TEST) test strip Use as instructed 08/15/20   Elsie Stain, MD  metFORMIN (GLUCOPHAGE) 500 MG tablet Take 2 tablets (1,000 mg total) by mouth 2 (two) times daily with a meal. 08/15/20 10/14/20  Elsie Stain, MD  metFORMIN (GLUCOPHAGE) 500 MG tablet TAKE 2 TABLETS (1,000 MG TOTAL) BY MOUTH 2 (TWO) TIMES DAILY WITH A MEAL. 10/31/20 10/31/21  Elsie Stain, MD  nicotine (NICODERM CQ - DOSED IN MG/24 HR) 7 mg/24hr patch Place 1 patch (7 mg total) onto the skin daily. 07/25/20  Elsie Stain, MD  nicotine (NICODERM CQ - DOSED IN MG/24 HR) 7 mg/24hr patch PLACE 1 PATCH (7 MG TOTAL) ONTO THE SKIN DAILY. 07/25/20 07/25/21  Elsie Stain, MD  TRUEplus Lancets 28G MISC Use to measure blood sugar twice a day 08/15/20   Elsie Stain, MD  TRUEplus Lancets 28G MISC USE TO MEASURE BLOOD SUGAR TWICE A DAY 08/15/20 08/15/21  Elsie Stain, MD  valsartan-hydrochlorothiazide (DIOVAN-HCT) 160-25 MG tablet TAKE 1 TABLET BY MOUTH DAILY. 10/31/20 10/31/21  Elsie Stain, MD  valsartan-hydrochlorothiazide (DIOVAN-HCT) 320-25 MG  tablet Take 1 tablet by mouth daily. 10/10/20   Elsie Stain, MD    Allergies    Patient has no known allergies.  Review of Systems   Review of Systems  Constitutional: Negative.  Negative for chills and fever.  HENT: Negative.  Negative for rhinorrhea and sore throat.   Respiratory: Negative.  Negative for cough and shortness of breath.   Cardiovascular: Negative.  Negative for chest pain.  Gastrointestinal: Negative.  Negative for abdominal pain, diarrhea, nausea and vomiting.  Musculoskeletal: Negative.  Negative for arthralgias and myalgias.  Neurological: Negative.  Negative for headaches.    Physical Exam Updated Vital Signs BP (!) 155/81   Pulse (!) 101   Temp 98.2 F (36.8 C)   Resp 18   Ht '5\' 2"'  (1.575 m)   Wt 104.3 kg   SpO2 96%   BMI 42.07 kg/m   Physical Exam Constitutional:      General: He is not in acute distress.    Appearance: Normal appearance. He is well-developed. He is not ill-appearing or diaphoretic.  HENT:     Head: Normocephalic and atraumatic.     Jaw: There is normal jaw occlusion.     Nose: Nose normal.     Mouth/Throat:     Mouth: Mucous membranes are moist.     Pharynx: Oropharynx is clear.  Eyes:     General: Vision grossly intact. Gaze aligned appropriately.     Pupils: Pupils are equal, round, and reactive to light.  Neck:     Trachea: Trachea and phonation normal. No tracheal tenderness or tracheal deviation.     Meningeal: Brudzinski's sign absent.  Cardiovascular:     Rate and Rhythm: Normal rate and regular rhythm.     Pulses: Normal pulses.     Heart sounds: Normal heart sounds.  Pulmonary:     Effort: Pulmonary effort is normal. No respiratory distress.     Breath sounds: Normal breath sounds.  Abdominal:     General: There is no distension.     Palpations: Abdomen is soft.     Tenderness: There is no abdominal tenderness. There is no guarding or rebound.  Musculoskeletal:        General: Normal range of motion.      Cervical back: Normal range of motion and neck supple.  Skin:    General: Skin is warm and dry.  Neurological:     Mental Status: He is alert.     GCS: GCS eye subscore is 4. GCS verbal subscore is 5. GCS motor subscore is 6.     Comments: Speech is clear and goal oriented, follows commands Major Cranial nerves without deficit, no facial droop Moves extremities without ataxia, coordination intact  Psychiatric:        Behavior: Behavior normal.     ED Results / Procedures / Treatments   Labs (all labs ordered are listed, but only abnormal results  are displayed) Labs Reviewed  SARS CORONAVIRUS 2 (TAT 6-24 HRS)    EKG None  Radiology No results found.  Procedures Procedures   Medications Ordered in ED Medications - No data to display  ED Course  I have reviewed the triage vital signs and the nursing notes.  Pertinent labs & imaging results that were available during my care of the patient were reviewed by me and considered in my medical decision making (see chart for details).    MDM Rules/Calculators/A&P                         Additional history obtained from: 1. Nursing notes from this visit. =============== Aldyn Toon was evaluated in Emergency Department on 11/13/2020 for the symptoms described in the history of present illness. He was evaluated in the context of the global COVID-19 pandemic, which necessitated consideration that the patient might be at risk for infection with the SARS-CoV-2 virus that causes COVID-19. Institutional protocols and algorithms that pertain to the evaluation of patients at risk for COVID-19 are in a state of rapid change based on information released by regulatory bodies including the CDC and federal and state organizations. These policies and algorithms were followed during the patient's care in the ED. - 55 year old male history as above presents for a COVID test today, exposed 2 days ago by coworker.  Patient with 1 dose  [employer] vaccine last year.  Patient asymptomatic today, reports he is feeling well.  On evaluation patient well-appearing no acute distress,.  Airway clear, no meningeal signs, cardiopulmonary exam unremarkable, abdomen soft nontender.  Vital signs are stable, he was slightly hypertensive on arrival, he has a history of this, patient courage to take his blood pressure medications as prescribed and follow-up with PCP for blood pressure recheck next week.  Patient informed of signs/symptoms of hypertensive urgency/emergency and to return to the ER if they occur additionally patient with heart rate 101 in triage, this has resolved upon my evaluation the patient, no tachycardia on exam.  As patient is asymptomatic there is no indication for blood work or further work-up at this time.  6-24-hour COVID test sent, patient aware to check his MyChart account for results in the next 1-2 days and discussed those results with his PCP.  Work note given x10 days, quarantine/isolation precautions discussed.  At this time there does not appear to be any evidence of an acute emergency medical condition and the patient appears stable for discharge with appropriate outpatient follow up. Diagnosis was discussed with patient who verbalizes understanding of care plan and is agreeable to discharge. I have discussed return precautions with patient who verbalizes understanding. Patient encouraged to follow-up with their PCP. All questions answered.  Discussed smoking cessation with patient and was they were offerred resources to help stop.  Total time was 5 min CPT code 99406.   Note: Portions of this report may have been transcribed using voice recognition software. Every effort was made to ensure accuracy; however, inadvertent computerized transcription errors may still be present. Final Clinical Impression(s) / ED Diagnoses Final diagnoses:  Encounter for laboratory testing for COVID-19 virus  Elevated blood pressure  reading    Rx / DC Orders ED Discharge Orders    None       Gari Crown 11/13/20 1007    Lajean Saver, MD 11/15/20 [phone]

## 2020-11-13 NOTE — ED Notes (Signed)
Patient verbalizes understanding of discharge instructions. Opportunity for questioning and answers were provided. Armband removed by staff, pt discharged from ED and ambulated to lobby to return home.   

## 2020-12-19 ENCOUNTER — Encounter: Payer: Self-pay | Admitting: *Deleted

## 2020-12-19 NOTE — Congregational Nurse Program (Unsigned)
  Dept: [phone]   Congregational Nurse Program Note  Date of Encounter: 12/19/2020  Past Medical History: Past Medical History:  Diagnosis Date  . COPD (chronic obstructive pulmonary disease) (H[facility])   . Diabetes mellitus without complication ([facility])   . Hypertension     Encounter Details:pt ask RN to check BP, 140/90, pt states he doesn't know why it is elevated. Also states "I have not taken trulicity in 1 month and I am fine, my sugar is in 90's to 110.

## 2020-12-20 ENCOUNTER — Emergency Department (HOSPITAL_COMMUNITY)
Admission: EM | Admit: 2020-12-20 | Discharge: 2020-12-20 | Disposition: A | Discharge status: Home / Self Care | Payer: 59 | Attending: Emergency Medicine | Admitting: Emergency Medicine

## 2020-12-20 ENCOUNTER — Encounter (HOSPITAL_COMMUNITY): Payer: Self-pay

## 2020-12-20 DIAGNOSIS — E119 Type 2 diabetes mellitus without complications: Secondary | ICD-10-CM | POA: Insufficient documentation

## 2020-12-20 DIAGNOSIS — Z7902 Long term (current) use of antithrombotics/antiplatelets: Secondary | ICD-10-CM | POA: Diagnosis not present

## 2020-12-20 DIAGNOSIS — Z7984 Long term (current) use of oral hypoglycemic drugs: Secondary | ICD-10-CM | POA: Insufficient documentation

## 2020-12-20 DIAGNOSIS — Z794 Long term (current) use of insulin: Secondary | ICD-10-CM | POA: Insufficient documentation

## 2020-12-20 DIAGNOSIS — Z7982 Long term (current) use of aspirin: Secondary | ICD-10-CM | POA: Insufficient documentation

## 2020-12-20 DIAGNOSIS — Z20822 Contact with and (suspected) exposure to covid-19: Secondary | ICD-10-CM | POA: Insufficient documentation

## 2020-12-20 DIAGNOSIS — I10 Essential (primary) hypertension: Secondary | ICD-10-CM | POA: Insufficient documentation

## 2020-12-20 DIAGNOSIS — Z79899 Other long term (current) drug therapy: Secondary | ICD-10-CM | POA: Diagnosis not present

## 2020-12-20 DIAGNOSIS — R0602 Shortness of breath: Secondary | ICD-10-CM | POA: Diagnosis not present

## 2020-12-20 DIAGNOSIS — Z87891 Personal history of nicotine dependence: Secondary | ICD-10-CM | POA: Insufficient documentation

## 2020-12-20 DIAGNOSIS — J449 Chronic obstructive pulmonary disease, unspecified: Secondary | ICD-10-CM | POA: Insufficient documentation

## 2020-12-20 LAB — RESP PANEL BY RT-PCR (FLU A&B, COVID) ARPGX2
Influenza A by PCR: NEGATIVE
Influenza B by PCR: NEGATIVE
SARS Coronavirus 2 by RT PCR: NEGATIVE

## 2020-12-20 NOTE — ED Triage Notes (Signed)
Pt states that some coworkers are covid+ and he wants tested, pt did not take home test

## 2020-12-20 NOTE — ED Provider Notes (Signed)
Emergency Medicine Provider Triage Evaluation Note  Donald Malone , a 55 y.o. male  was evaluated in triage.  Pt complains of SOB.  States he's been around others with COVID.  Would like to be tested.  Has COPD.  Feels COVID could be causing his SOB.  Denies any other symptoms.  Review of Systems  Positive: SOB Negative: Fever, cough, bodyaches  Physical Exam  BP (!) 146/76 (BP Location: Left Arm)   Pulse 82   Temp 97.7 F (36.5 C) (Oral)   Resp 16   SpO2 98%  Gen:   Awake, no distress   Resp:  Normal effort  MSK:   Moves extremities without difficulty  Other:    Medical Decision Making  Medically screening exam initiated at 6:26 AM.  Appropriate orders placed.  Greene Diodato Venus was informed that the remainder of the evaluation will be completed by another provider, this initial triage assessment does not replace that evaluation, and the importance of remaining in the ED until their evaluation is complete.     Roxy Horseman, PA-C 12/20/20 7412    Geoffery Lyons, MD 12/20/20 [phone]

## 2020-12-20 NOTE — Discharge Instructions (Addendum)
Your COVID test was negative.  Follow-up with your primary care doctor.  Return to ER if you develop chest pain difficulty breathing or other new concerning symptom.

## 2020-12-27 NOTE — ED Provider Notes (Signed)
[facility] EMERGENCY DEPARTMENT Provider Note   CSN: [id number] Arrival date & time: 12/20/20  0175     History Chief Complaint  Patient presents with   Covid Exposure    Donald Malone is a 55 y.o. male.  Past medical history COPD, diabetes, hypertension presents to ER with concern for COVID exposure.  Patient reports close COVID exposures and requesting COVID testing.  Had reported shortness of breath to triage provider but denies any ongoing shortness of breath at present.  No cough or fevers, no chest pain or back pain.  Denies any ongoing complaints.  HPI     Past Medical History:  Diagnosis Date   COPD (chronic obstructive pulmonary disease) ([place])    Diabetes mellitus without complication ([place])    Hypertension     Patient Active Problem List   Diagnosis Date Noted   Ischemic cerebrovascular accident (CVA) ([place]) 02/02/2020   Neurologic deficit due to acute ischemic cerebrovascular accident (CVA) ([place]) 02/01/2020   COPD (chronic obstructive pulmonary disease) ([place])    Diabetes mellitus without complication ([place])    Hypertension     History reviewed. No pertinent surgical history.     No family history on file.  Social History   Tobacco Use   Smoking status: Former    Packs/day: 1.00    Pack years: 0.00    Types: Cigarettes   Smokeless tobacco: Never    Home Medications Prior to Admission medications   Medication Sig Start Date End Date Taking? Authorizing Provider  amLODipine (NORVASC) 10 MG tablet Take 1 tablet (10 mg total) by mouth daily. 08/15/20   Elsie Stain, MD  amLODipine (NORVASC) 10 MG tablet TAKE 1 TABLET (10 MG TOTAL) BY MOUTH DAILY. 10/31/20 10/31/21  Elsie Stain, MD  aspirin 325 MG EC tablet Take 1 tablet (325 mg total) by mouth daily. 07/25/20   Elsie Stain, MD  aspirin EC 325 MG tablet TAKE 1 TABLET (325 MG TOTAL) BY MOUTH DAILY. 10/31/20 10/31/21  Elsie Stain, MD  Blood Glucose Monitoring Suppl  (TRUE METRIX METER) w/Device KIT Use to measure blood sugar twice a day 08/15/20   Elsie Stain, MD  Blood Glucose Monitoring Suppl (TRUE METRIX METER) w/Device KIT USE TO MEASURE BLOOD SUGAR TWICE A DAY 08/15/20 08/15/21  Elsie Stain, MD  clopidogrel (PLAVIX) 75 MG tablet TAKE 1 TABLET (75 MG TOTAL) BY MOUTH DAILY. 10/31/20 10/31/21  Elsie Stain, MD  Dulaglutide (TRULICITY) 1.02 HE/5.2DP SOPN Inject 0.75 mg into the skin once a week. 10/03/20   Elsie Stain, MD  Dulaglutide 0.75 MG/0.5ML SOPN INJECT 0.75 MG INTO THE SKIN ONCE A WEEK. 10/31/20 10/31/21  Elsie Stain, MD  glucose blood (TRUE METRIX BLOOD GLUCOSE TEST) test strip Use as instructed 08/15/20   Elsie Stain, MD  metFORMIN (GLUCOPHAGE) 500 MG tablet Take 2 tablets (1,000 mg total) by mouth 2 (two) times daily with a meal. 08/15/20 10/14/20  Elsie Stain, MD  metFORMIN (GLUCOPHAGE) 500 MG tablet TAKE 2 TABLETS (1,000 MG TOTAL) BY MOUTH 2 (TWO) TIMES DAILY WITH A MEAL. 10/31/20 10/31/21  Elsie Stain, MD  nicotine (NICODERM CQ - DOSED IN MG/24 HR) 7 mg/24hr patch Place 1 patch (7 mg total) onto the skin daily. 07/25/20   Elsie Stain, MD  nicotine (NICODERM CQ - DOSED IN MG/24 HR) 7 mg/24hr patch PLACE 1 PATCH (7 MG TOTAL) ONTO THE SKIN DAILY. 07/25/20 07/25/21  Elsie Stain, MD  TRUEplus Lancets 28G MISC Use to measure blood sugar twice a day 08/15/20   Elsie Stain, MD  TRUEplus Lancets 28G MISC USE TO MEASURE BLOOD SUGAR TWICE A DAY 08/15/20 08/15/21  Elsie Stain, MD  valsartan-hydrochlorothiazide (DIOVAN-HCT) 160-25 MG tablet TAKE 1 TABLET BY MOUTH DAILY. 10/31/20 10/31/21  Elsie Stain, MD  valsartan-hydrochlorothiazide (DIOVAN-HCT) 320-25 MG tablet Take 1 tablet by mouth daily. 10/10/20   Elsie Stain, MD    Allergies    Patient has no known allergies.  Review of Systems   Review of Systems  All other systems reviewed and are negative.  Physical Exam Updated Vital Signs BP 138/70  (BP Location: Right Arm)   Pulse 89   Temp 98.2 F (36.8 C) (Oral)   Resp 18   SpO2 98%   Physical Exam Vitals and nursing note reviewed.  Constitutional:      Appearance: He is well-developed.  HENT:     Head: Normocephalic and atraumatic.  Eyes:     Conjunctiva/sclera: Conjunctivae normal.  Cardiovascular:     Rate and Rhythm: Normal rate and regular rhythm.     Heart sounds: No murmur heard. Pulmonary:     Effort: Pulmonary effort is normal. No respiratory distress.     Breath sounds: Normal breath sounds.     Comments: Lungs clear Abdominal:     Palpations: Abdomen is soft.     Tenderness: There is no abdominal tenderness.  Musculoskeletal:     Cervical back: Neck supple.  Skin:    General: Skin is warm and dry.  Neurological:     Mental Status: He is alert.    ED Results / Procedures / Treatments   Labs (all labs ordered are listed, but only abnormal results are displayed) Labs Reviewed  RESP PANEL BY RT-PCR (FLU A&B, COVID) [id number]    EKG None  Radiology No results found.  Procedures Procedures   Medications Ordered in ED Medications - No data to display  ED Course  I have reviewed the triage vital signs and the nursing notes.  Pertinent labs & imaging results that were available during my care of the patient were reviewed by me and considered in my medical decision making (see chart for details).    MDM Rules/Calculators/A&P                          55 year old gentleman presented to ER with concern for COVID exposure.  Having for shortness of breath to triage provider flight denied any ongoing shortness of breath on my evaluation.  No hypoxia or tachypnea, lungs clear.  No ongoing medical complaints.  COVID test is negative.  Discharged home and recommend follow-up with primary doctor.   After the discussed management above, the patient was determined to be safe for discharge.  The patient was in agreement with this plan and all questions  regarding their care were answered.  ED return precautions were discussed and the patient will return to the ED with any significant worsening of condition.  Final Clinical Impression(s) / ED Diagnoses Final diagnoses:  Close exposure to COVID-19 virus    Rx / DC Orders ED Discharge Orders     None        Lucrezia Starch, MD 12/27/20 [phone]

## 2021-01-09 ENCOUNTER — Other Ambulatory Visit: Payer: Self-pay | Admitting: Critical Care Medicine

## 2021-01-09 ENCOUNTER — Encounter: Payer: Self-pay | Admitting: Critical Care Medicine

## 2021-01-09 ENCOUNTER — Telehealth: Payer: Self-pay

## 2021-01-09 MED ORDER — TRUEPLUS LANCETS 28G MISC: 1 refills | Status: DC

## 2021-01-09 MED ORDER — AMLODIPINE BESYLATE 10 MG PO TABS: ORAL_TABLET | Freq: Every day | ORAL | 1 refills | Status: DC

## 2021-01-09 MED ORDER — METFORMIN HCL 500 MG PO TABS: ORAL_TABLET | Freq: Two times a day (BID) | ORAL | 1 refills | Status: DC

## 2021-01-09 MED ORDER — TRUE METRIX BLOOD GLUCOSE TEST VI STRP: ORAL_STRIP | 12 refills | Status: DC

## 2021-01-09 MED ORDER — VALSARTAN-HYDROCHLOROTHIAZIDE 320-25 MG PO TABS: 1.0000 | ORAL_TABLET | Freq: Every day | ORAL | 3 refills | Status: DC

## 2021-01-09 MED ORDER — CLOPIDOGREL BISULFATE 75 MG PO TABS: ORAL_TABLET | Freq: Every day | ORAL | 1 refills | Status: DC

## 2021-01-09 MED ORDER — ASPIRIN EC 325 MG PO TBEC: DELAYED_RELEASE_TABLET | Freq: Every day | ORAL | 1 refills | Status: DC

## 2021-01-09 NOTE — Telephone Encounter (Signed)
[facility] called and spoke to Donald Malone about the refill(s) True Metric Meter Device Kit requested. She says that she spoke to someone in the office and Dr. Joya Gaskins sent a Rx, so they are filling it now, we can disregard this message.    Copied from [place] [id number]. Topic: Quick Communication - Rx Refill/Question >> Jan 09, 2021  3:54 PM Tessa Lerner A wrote: Medication: Blood Glucose Monitoring Suppl (TRUE METRIX METER) w/Device KIT   Has the patient contacted their pharmacy? Yes (Agent: If no, request that the patient contact the pharmacy for the refill.) (Agent: If yes, when and what did the pharmacy advise?)  Preferred Pharmacy (with phone number or street name): [facility] - [place], [place] - [address]  Phone:  [phone] Fax:  [phone]  Agent: Please be advised that RX refills may take up to 3 business days. We ask that you follow-up with your pharmacy.

## 2021-01-10 NOTE — Progress Notes (Signed)
Patient ID: Camryn Quesinberry, male   DOB: [birth year], 55 y.o.   MRN: [id number] This is a 55 year old male seen in the [facility] history of hypertension diabetes.  His blood sugars have been improved.  He is staying on metformin but did stop the Trulicity.  Blood sugar on arrival today is 109 blood pressure 156/94 pulse 72 saturation 100%.  Note this patient had a reduced dose of valsartan HCT and since that time the blood pressures been increasing  The patient is no longer smoking he will still does occasionally drink alcohol admits to about a beer every week or so but residents at the shelter states he drinks more often and in larger amounts.  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

## 2021-01-10 NOTE — Telephone Encounter (Signed)
Latoya, from [employer], calling stating that she is needing to apply medication to their pt assistance program to help pay for medications. She states that she is needing to have a referral placed to add pt to program and is needing to speak with Myriam Jacobson. Please advise.

## 2021-01-23 ENCOUNTER — Other Ambulatory Visit (HOSPITAL_COMMUNITY): Payer: Self-pay

## 2021-01-23 ENCOUNTER — Other Ambulatory Visit: Payer: Self-pay | Admitting: Critical Care Medicine

## 2021-01-23 ENCOUNTER — Encounter: Payer: Self-pay | Admitting: Critical Care Medicine

## 2021-01-23 MED ORDER — AMLODIPINE BESYLATE 10 MG PO TABS
10.0000 mg | ORAL_TABLET | Freq: Every day | ORAL | 1 refills | Status: DC
  Filled 2021-01-23: qty 30, 30d supply, fill #0

## 2021-01-23 MED ORDER — VALSARTAN-HYDROCHLOROTHIAZIDE 320-25 MG PO TABS
1.0000 | ORAL_TABLET | Freq: Every day | ORAL | 3 refills | Status: DC
  Filled 2021-01-23: qty 30, 30d supply, fill #0

## 2021-01-23 MED ORDER — TRUE METRIX BLOOD GLUCOSE TEST VI STRP
ORAL_STRIP | 12 refills | Status: DC
  Filled 2021-01-23: qty 100, 30d supply, fill #0

## 2021-01-23 MED ORDER — METFORMIN HCL 500 MG PO TABS
1000.0000 mg | ORAL_TABLET | Freq: Two times a day (BID) | ORAL | 1 refills | Status: DC
  Filled 2021-01-23: qty 120, 30d supply, fill #0

## 2021-01-23 MED ORDER — CLOPIDOGREL BISULFATE 75 MG PO TABS
75.0000 mg | ORAL_TABLET | Freq: Every day | ORAL | 1 refills | Status: DC
  Filled 2021-01-23: qty 30, 30d supply, fill #0

## 2021-01-23 MED ORDER — ASPIRIN EC 325 MG PO TBEC
DELAYED_RELEASE_TABLET | Freq: Every day | ORAL | 1 refills | Status: DC
  Filled 2021-01-23: qty 100, fill #0
  Filled 2021-01-23: qty 100, 30d supply, fill #0

## 2021-01-24 ENCOUNTER — Other Ambulatory Visit (HOSPITAL_COMMUNITY): Payer: Self-pay

## 2021-01-24 NOTE — Progress Notes (Signed)
Patient ID: Donald Malone, male   DOB: [birth year], 55 y.o.   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

## 2021-01-25 ENCOUNTER — Other Ambulatory Visit (HOSPITAL_COMMUNITY): Payer: Self-pay

## 2021-01-31 ENCOUNTER — Other Ambulatory Visit (HOSPITAL_COMMUNITY): Payer: Self-pay

## 2021-02-13 ENCOUNTER — Encounter: Payer: Self-pay | Admitting: Critical Care Medicine

## 2021-02-13 ENCOUNTER — Other Ambulatory Visit: Payer: Self-pay | Admitting: Critical Care Medicine

## 2021-02-13 MED ORDER — CLOPIDOGREL BISULFATE 75 MG PO TABS
75.0000 mg | ORAL_TABLET | Freq: Every day | ORAL | 1 refills | Status: DC
  Filled 2021-02-13: qty 30, 30d supply, fill #0

## 2021-02-13 MED ORDER — ASPIRIN EC 325 MG PO TBEC
325.0000 mg | DELAYED_RELEASE_TABLET | Freq: Every day | ORAL | 1 refills | Status: DC
  Filled 2021-02-13: qty 100, fill #0
  Filled 2021-02-14: qty 30, 30d supply, fill #0

## 2021-02-13 MED ORDER — AMLODIPINE BESYLATE 10 MG PO TABS
10.0000 mg | ORAL_TABLET | Freq: Every day | ORAL | 1 refills | Status: DC
  Filled 2021-02-13: qty 30, 30d supply, fill #0

## 2021-02-13 MED ORDER — VALSARTAN-HYDROCHLOROTHIAZIDE 320-25 MG PO TABS
1.0000 | ORAL_TABLET | Freq: Every day | ORAL | 3 refills | Status: DC
  Filled 2021-02-13: qty 30, 30d supply, fill #0

## 2021-02-13 NOTE — Progress Notes (Signed)
refills  

## 2021-02-14 ENCOUNTER — Other Ambulatory Visit (HOSPITAL_COMMUNITY): Payer: Self-pay

## 2021-02-22 ENCOUNTER — Other Ambulatory Visit (HOSPITAL_COMMUNITY): Payer: Self-pay

## 2021-03-20 ENCOUNTER — Other Ambulatory Visit: Payer: Self-pay | Admitting: Physician Assistant

## 2021-03-20 ENCOUNTER — Encounter: Payer: Self-pay | Admitting: Physician Assistant

## 2021-03-20 ENCOUNTER — Other Ambulatory Visit: Payer: Self-pay | Admitting: *Deleted

## 2021-03-20 MED ORDER — AMLODIPINE BESYLATE 10 MG PO TABS: 10.0000 mg | ORAL_TABLET | Freq: Every day | ORAL | 1 refills | Status: DC

## 2021-03-20 MED ORDER — ASPIRIN EC 325 MG PO TBEC: 325.0000 mg | DELAYED_RELEASE_TABLET | Freq: Every day | ORAL | 1 refills | Status: DC

## 2021-03-20 MED ORDER — VALSARTAN-HYDROCHLOROTHIAZIDE 320-25 MG PO TABS: 1.0000 | ORAL_TABLET | Freq: Every day | ORAL | 3 refills | Status: DC

## 2021-03-20 MED ORDER — METFORMIN HCL 500 MG PO TABS: 1000.0000 mg | ORAL_TABLET | Freq: Two times a day (BID) | ORAL | 1 refills | Status: DC

## 2021-03-20 MED ORDER — CLOPIDOGREL BISULFATE 75 MG PO TABS: 75.0000 mg | ORAL_TABLET | Freq: Every day | ORAL | 1 refills | Status: DC

## 2021-03-20 NOTE — Progress Notes (Signed)
Donald Malone has a room, will be moving as soon as tomorrow.  Has not taken medications in a month.   Miss Donald Malone is re ordering his meds. He has not been checking his blood sugar.   CBG 156  Tries to avoid sweets.   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  Meds sent to [facility] on [address]. He says will be able to walk there.  Theodore Demark, PA-C 03/20/2021 3:23 PM

## 2021-03-28 ENCOUNTER — Ambulatory Visit: Payer: 59 | Admitting: Critical Care Medicine

## 2021-06-21 ENCOUNTER — Emergency Department (HOSPITAL_COMMUNITY)
Admission: EM | Admit: 2021-06-21 | Discharge: 2021-06-22 | Disposition: A | Discharge status: Home / Self Care | Payer: 59 | Attending: Emergency Medicine | Admitting: Emergency Medicine

## 2021-06-21 ENCOUNTER — Other Ambulatory Visit: Payer: Self-pay

## 2021-06-21 DIAGNOSIS — E119 Type 2 diabetes mellitus without complications: Secondary | ICD-10-CM | POA: Insufficient documentation

## 2021-06-21 DIAGNOSIS — J449 Chronic obstructive pulmonary disease, unspecified: Secondary | ICD-10-CM | POA: Diagnosis not present

## 2021-06-21 DIAGNOSIS — T405X1A Poisoning by cocaine, accidental (unintentional), initial encounter: Secondary | ICD-10-CM | POA: Insufficient documentation

## 2021-06-21 DIAGNOSIS — Z7984 Long term (current) use of oral hypoglycemic drugs: Secondary | ICD-10-CM | POA: Diagnosis not present

## 2021-06-21 DIAGNOSIS — I10 Essential (primary) hypertension: Secondary | ICD-10-CM | POA: Diagnosis not present

## 2021-06-21 DIAGNOSIS — Z7902 Long term (current) use of antithrombotics/antiplatelets: Secondary | ICD-10-CM | POA: Diagnosis not present

## 2021-06-21 DIAGNOSIS — Z87891 Personal history of nicotine dependence: Secondary | ICD-10-CM | POA: Insufficient documentation

## 2021-06-21 DIAGNOSIS — Z7982 Long term (current) use of aspirin: Secondary | ICD-10-CM | POA: Insufficient documentation

## 2021-06-21 DIAGNOSIS — F1490 Cocaine use, unspecified, uncomplicated: Secondary | ICD-10-CM | POA: Diagnosis not present

## 2021-06-21 DIAGNOSIS — Z79899 Other long term (current) drug therapy: Secondary | ICD-10-CM | POA: Insufficient documentation

## 2021-06-21 DIAGNOSIS — T50901A Poisoning by unspecified drugs, medicaments and biological substances, accidental (unintentional), initial encounter: Secondary | ICD-10-CM

## 2021-06-21 NOTE — ED Triage Notes (Signed)
Patient did not tell EMS what he took. Pupils pinpoint. Patient in respiratroy arrest when EMS arrived  1mg  IM Narcan Then 0.5IV  Patient snapped up more alert.   EMS 145/95 HR 101 95% Room air 18G Right AC

## 2021-06-22 ENCOUNTER — Encounter (HOSPITAL_COMMUNITY): Payer: Self-pay

## 2021-06-22 LAB — COMPREHENSIVE METABOLIC PANEL
ALT: 104 U/L — ABNORMAL HIGH (ref 0–44)
AST: 176 U/L — ABNORMAL HIGH (ref 15–41)
Albumin: 3.6 g/dL (ref 3.5–5.0)
Alkaline Phosphatase: 98 U/L (ref 38–126)
Anion gap: 12 (ref 5–15)
BUN: 16 mg/dL (ref 6–20)
CO2: 20 mmol/L — ABNORMAL LOW (ref 22–32)
Calcium: 7.9 mg/dL — ABNORMAL LOW (ref 8.9–10.3)
Chloride: 101 mmol/L (ref 98–111)
Creatinine, Ser: 1.43 mg/dL — ABNORMAL HIGH (ref 0.61–1.24)
GFR, Estimated: 59 mL/min — ABNORMAL LOW (ref >60)
Glucose, Bld: 359 mg/dL — ABNORMAL HIGH (ref 70–99)
Potassium: 4.9 mmol/L (ref 3.5–5.1)
Sodium: 133 mmol/L — ABNORMAL LOW (ref 135–145)
Total Bilirubin: 1.3 mg/dL — ABNORMAL HIGH (ref 0.3–1.2)
Total Protein: 7.3 g/dL (ref 6.5–8.1)

## 2021-06-22 LAB — CBC
HCT: 44.3 % (ref 39.0–52.0)
Hemoglobin: 14.6 g/dL (ref 13.0–17.0)
MCH: 28.1 pg (ref 26.0–34.0)
MCHC: 33 g/dL (ref 30.0–36.0)
MCV: 85.2 fL (ref 80.0–100.0)
Platelets: 186 10*3/uL (ref 150–400)
RBC: 5.2 MIL/uL (ref 4.22–5.81)
RDW: 12.7 % (ref 11.5–15.5)
WBC: 8.4 10*3/uL (ref 4.0–10.5)
nRBC: 0 % (ref 0.0–0.2)

## 2021-06-22 LAB — ETHANOL: Alcohol, Ethyl (B): <10 mg/dL (ref <10)

## 2021-06-22 LAB — RAPID URINE DRUG SCREEN, HOSP PERFORMED
Amphetamines: NOT DETECTED
Barbiturates: NOT DETECTED
Benzodiazepines: NOT DETECTED
Cocaine: POSITIVE — AB
Opiates: NOT DETECTED
Tetrahydrocannabinol: NOT DETECTED

## 2021-06-22 LAB — ACETAMINOPHEN LEVEL: Acetaminophen (Tylenol), Serum: <10 ug/mL — ABNORMAL LOW (ref 10–30)

## 2021-06-22 LAB — SALICYLATE LEVEL: Salicylate Lvl: <7 mg/dL — ABNORMAL LOW (ref 7.0–30.0)

## 2021-06-22 NOTE — ED Provider Notes (Signed)
[facility] DEPT Provider Note   CSN: [id number] Arrival date & time: 06/21/21  2350     History Chief Complaint  Patient presents with   Drug Overdose    Donald Malone is a 55 y.o. male.  The history is provided by the patient and medical records.  Drug Overdose  55 y.o. M with hx of COPD, DM, HTN, presenting to the ED for drug overdose.  Patient states he was at the bus stop with a friend, admittedly smoking crack, and the next thing he knows he woke up with paramedics around him.  He was unresponsive upon EMS arrival given narcan and woke up.  He denies knowingly using narcotics but is concerned crack may have been laced with something.  He denies alcohol use.  Denies any chest pain, SOB, fever, chills, sweats, etc.  Adamantly denies any attempt at self harm or other suicidal thoughts.  Past Medical History:  Diagnosis Date   COPD (chronic obstructive pulmonary disease) ([place])    Diabetes mellitus without complication ([place])    Hypertension     Patient Active Problem List   Diagnosis Date Noted   Ischemic cerebrovascular accident (CVA) ([place]) 02/02/2020   Neurologic deficit due to acute ischemic cerebrovascular accident (CVA) ([place]) 02/01/2020   COPD (chronic obstructive pulmonary disease) ([place])    Diabetes mellitus without complication ([place])    Hypertension     History reviewed. No pertinent surgical history.     History reviewed. No pertinent family history.  Social History   Tobacco Use   Smoking status: Former    Packs/day: 1.00    Types: Cigarettes   Smokeless tobacco: Never    Home Medications Prior to Admission medications   Medication Sig Start Date End Date Taking? Authorizing Provider  amLODipine (NORVASC) 10 MG tablet Take 1 tablet (10 mg total) by mouth daily. 03/20/21   Elsie Stain, MD  aspirin EC 325 MG tablet Take 1 tablet (325 mg total) by mouth daily. 03/20/21   Elsie Stain, MD  Blood Glucose  Monitoring Suppl (TRUE METRIX METER) w/Device KIT Use to measure blood sugar twice a day 08/15/20   Elsie Stain, MD  Blood Glucose Monitoring Suppl (TRUE METRIX METER) w/Device KIT USE TO MEASURE BLOOD SUGAR TWICE A DAY 08/15/20 08/15/21  Elsie Stain, MD  clopidogrel (PLAVIX) 75 MG tablet Take 1 tablet (75 mg total) by mouth daily. 03/20/21   Elsie Stain, MD  glucose blood (TRUE METRIX BLOOD GLUCOSE TEST) test strip Use as instructed 01/23/21   Elsie Stain, MD  metFORMIN (GLUCOPHAGE) 500 MG tablet Take 2 tablets (1,000 mg total) by mouth 2 (two) times daily with a meal. 03/20/21   Elsie Stain, MD  TRUEplus Lancets 28G MISC Use to measure blood sugar twice a day 01/09/21   Elsie Stain, MD  valsartan-hydrochlorothiazide (DIOVAN-HCT) 320-25 MG tablet Take 1 tablet by mouth daily. 03/20/21   Elsie Stain, MD    Allergies    Patient has no known allergies.  Review of Systems   Review of Systems  Psychiatric/Behavioral:         OD  All other systems reviewed and are negative.  Physical Exam Updated Vital Signs BP 125/76   Pulse 75   Temp 97.8 F (36.6 C) (Oral)   Resp 14   Ht '5\' 2"'  (1.575 m)   Wt 104.3 kg   SpO2 93%   BMI 42.06 kg/m   Physical Exam Vitals  and nursing note reviewed.  Constitutional:      Appearance: He is well-developed.  HENT:     Head: Normocephalic and atraumatic.  Eyes:     Conjunctiva/sclera: Conjunctivae normal.     Pupils: Pupils are equal, round, and reactive to light.  Cardiovascular:     Rate and Rhythm: Normal rate and regular rhythm.     Heart sounds: Normal heart sounds.  Pulmonary:     Effort: Pulmonary effort is normal. No respiratory distress.     Breath sounds: Normal breath sounds. No rhonchi.  Abdominal:     General: Bowel sounds are normal.     Palpations: Abdomen is soft.     Tenderness: There is no abdominal tenderness. There is no rebound.  Musculoskeletal:        General: Normal range of motion.      Cervical back: Normal range of motion.  Skin:    General: Skin is warm and dry.  Neurological:     Mental Status: He is alert and oriented to person, place, and time.    ED Results / Procedures / Treatments   Labs (all labs ordered are listed, but only abnormal results are displayed) Labs Reviewed  COMPREHENSIVE METABOLIC PANEL - Abnormal; Notable for the following components:      Result Value   Sodium 133 (*)    CO2 20 (*)    Glucose, Bld 359 (*)    Creatinine, Ser 1.43 (*)    Calcium 7.9 (*)    AST 176 (*)    ALT 104 (*)    Total Bilirubin 1.3 (*)    GFR, Estimated 59 (*)    All other components within normal limits  RAPID URINE DRUG SCREEN, HOSP PERFORMED - Abnormal; Notable for the following components:   Cocaine POSITIVE (*)    All other components within normal limits  SALICYLATE LEVEL - Abnormal; Notable for the following components:   Salicylate Lvl <1.0 (*)    All other components within normal limits  ACETAMINOPHEN LEVEL - Abnormal; Notable for the following components:   Acetaminophen (Tylenol), Serum <10 (*)    All other components within normal limits  ETHANOL  CBC    EKG EKG Interpretation  Date/Time:  Saturday June 22 2021 00:09:50 EST Ventricular Rate:  96 PR Interval:  173 QRS Duration: 84 QT Interval:  399 QTC Calculation: 505 R Axis:   21 Text Interpretation: Sinus rhythm Consider left atrial enlargement Borderline T wave abnormalities Prolonged QT interval No significant change was found Confirmed by Ezequiel Essex [phone]) on 06/22/2021 12:16:08 AM  Radiology No results found.  Procedures Procedures   Medications Ordered in ED Medications - No data to display  ED Course  I have reviewed the triage vital signs and the nursing notes.  Pertinent labs & imaging results that were available during my care of the patient were reviewed by me and considered in my medical decision making (see chart for details).    MDM  Rules/Calculators/A&P                           55 year old male here after accidental overdose.  Was using crack cocaine at bus stop with friend and woke up to paramedics working on him.  He apparently was apneic but responded well to Narcan.  He only reports using crack cocaine but does have concerned that this may have been "laced" with something else.  He denies any attempt at self-harm or  other suicidal thoughts.  EKG here is nonischemic.  He denies any chest pain or shortness of breath.  Labs were obtained and are overall reassuring.  He was monitored here and vitals have remained stable.  He has not required further doses of Narcan.  Remains adamant that this was not an attempt at self-harm and voices that he will refrain from using illicit drugs in the future.  Feel he is stable for discharge home.  Can follow-up closely with PCP.  Return here for any new or acute changes.  Final Clinical Impression(s) / ED Diagnoses Final diagnoses:  Crack cocaine use  Accidental overdose, initial encounter    Rx / DC Orders ED Discharge Orders     None        Larene Pickett, PA-C 06/22/21 0539    Ezequiel Essex, MD 06/22/21 [phone]

## 2021-06-22 NOTE — Discharge Instructions (Signed)
Refrain from using illicit drugs. Follow-up with your primary care doctor. Return here for new concerns.

## 2021-09-05 ENCOUNTER — Observation Stay (HOSPITAL_COMMUNITY)
Admission: EM | Admit: 2021-09-05 | Discharge: 2021-09-06 | Disposition: A | Discharge status: Home / Self Care | Payer: Self-pay | Attending: Internal Medicine | Admitting: Internal Medicine

## 2021-09-05 DIAGNOSIS — I5041 Acute combined systolic (congestive) and diastolic (congestive) heart failure: Secondary | ICD-10-CM | POA: Insufficient documentation

## 2021-09-05 DIAGNOSIS — E1165 Type 2 diabetes mellitus with hyperglycemia: Secondary | ICD-10-CM | POA: Insufficient documentation

## 2021-09-05 DIAGNOSIS — W19XXXA Unspecified fall, initial encounter: Secondary | ICD-10-CM

## 2021-09-05 DIAGNOSIS — R55 Syncope and collapse: Principal | ICD-10-CM | POA: Insufficient documentation

## 2021-09-05 DIAGNOSIS — I10 Essential (primary) hypertension: Secondary | ICD-10-CM | POA: Diagnosis present

## 2021-09-05 DIAGNOSIS — R7401 Elevation of levels of liver transaminase levels: Secondary | ICD-10-CM

## 2021-09-05 DIAGNOSIS — R778 Other specified abnormalities of plasma proteins: Secondary | ICD-10-CM | POA: Insufficient documentation

## 2021-09-05 DIAGNOSIS — Z7902 Long term (current) use of antithrombotics/antiplatelets: Secondary | ICD-10-CM | POA: Insufficient documentation

## 2021-09-05 DIAGNOSIS — I639 Cerebral infarction, unspecified: Secondary | ICD-10-CM | POA: Diagnosis present

## 2021-09-05 DIAGNOSIS — Z7982 Long term (current) use of aspirin: Secondary | ICD-10-CM | POA: Insufficient documentation

## 2021-09-05 DIAGNOSIS — E119 Type 2 diabetes mellitus without complications: Secondary | ICD-10-CM

## 2021-09-05 DIAGNOSIS — Z87891 Personal history of nicotine dependence: Secondary | ICD-10-CM | POA: Insufficient documentation

## 2021-09-05 DIAGNOSIS — Z7984 Long term (current) use of oral hypoglycemic drugs: Secondary | ICD-10-CM | POA: Insufficient documentation

## 2021-09-05 DIAGNOSIS — I110 Hypertensive heart disease with heart failure: Secondary | ICD-10-CM | POA: Insufficient documentation

## 2021-09-05 DIAGNOSIS — J449 Chronic obstructive pulmonary disease, unspecified: Secondary | ICD-10-CM | POA: Insufficient documentation

## 2021-09-05 DIAGNOSIS — Z8673 Personal history of transient ischemic attack (TIA), and cerebral infarction without residual deficits: Secondary | ICD-10-CM | POA: Insufficient documentation

## 2021-09-05 DIAGNOSIS — Z20822 Contact with and (suspected) exposure to covid-19: Secondary | ICD-10-CM | POA: Insufficient documentation

## 2021-09-05 LAB — CBC WITH DIFFERENTIAL/PLATELET
Abs Immature Granulocytes: 0.01 10*3/uL (ref 0.00–0.07)
Basophils Absolute: 0 10*3/uL (ref 0.0–0.1)
Basophils Relative: 0 %
Eosinophils Absolute: 0.1 10*3/uL (ref 0.0–0.5)
Eosinophils Relative: 2 %
HCT: 45 % (ref 39.0–52.0)
Hemoglobin: 14.5 g/dL (ref 13.0–17.0)
Immature Granulocytes: 0 %
Lymphocytes Relative: 29 %
Lymphs Abs: 1.4 10*3/uL (ref 0.7–4.0)
MCH: 27.3 pg (ref 26.0–34.0)
MCHC: 32.2 g/dL (ref 30.0–36.0)
MCV: 84.7 fL (ref 80.0–100.0)
Monocytes Absolute: 0.4 10*3/uL (ref 0.1–1.0)
Monocytes Relative: 9 %
Neutro Abs: 2.7 10*3/uL (ref 1.7–7.7)
Neutrophils Relative %: 60 %
Platelets: 189 10*3/uL (ref 150–400)
RBC: 5.31 MIL/uL (ref 4.22–5.81)
RDW: 12.9 % (ref 11.5–15.5)
WBC: 4.6 10*3/uL (ref 4.0–10.5)
nRBC: 0 % (ref 0.0–0.2)

## 2021-09-05 LAB — TROPONIN I (HIGH SENSITIVITY): Troponin I (High Sensitivity): 72 ng/L — ABNORMAL HIGH (ref <18)

## 2021-09-05 LAB — COMPREHENSIVE METABOLIC PANEL
ALT: 53 U/L — ABNORMAL HIGH (ref 0–44)
AST: 42 U/L — ABNORMAL HIGH (ref 15–41)
Albumin: 3.6 g/dL (ref 3.5–5.0)
Alkaline Phosphatase: 87 U/L (ref 38–126)
Anion gap: 10 (ref 5–15)
BUN: 14 mg/dL (ref 6–20)
CO2: 25 mmol/L (ref 22–32)
Calcium: 9.2 mg/dL (ref 8.9–10.3)
Chloride: 101 mmol/L (ref 98–111)
Creatinine, Ser: 1.16 mg/dL (ref 0.61–1.24)
GFR, Estimated: >60 mL/min (ref >60)
Glucose, Bld: 212 mg/dL — ABNORMAL HIGH (ref 70–99)
Potassium: 3.8 mmol/L (ref 3.5–5.1)
Sodium: 136 mmol/L (ref 135–145)
Total Bilirubin: 0.4 mg/dL (ref 0.3–1.2)
Total Protein: 7.4 g/dL (ref 6.5–8.1)

## 2021-09-05 LAB — URINALYSIS, ROUTINE W REFLEX MICROSCOPIC
Bilirubin Urine: NEGATIVE
Glucose, UA: >=500 mg/dL — AB
Ketones, ur: NEGATIVE mg/dL
Leukocytes,Ua: NEGATIVE
Nitrite: NEGATIVE
Protein, ur: 100 mg/dL — AB
Specific Gravity, Urine: 1.022 (ref 1.005–1.030)
pH: 6 (ref 5.0–8.0)

## 2021-09-05 NOTE — ED Triage Notes (Signed)
Pt c/o syncopal event after walking, felt flushed & sat down prior to event. -hitting head, A&O on arrival to ED. States he came to & got a bus to ED. Hx HTN, unmedicated.

## 2021-09-05 NOTE — ED Provider Triage Note (Signed)
Emergency Medicine Provider Triage Evaluation Note  Donald Malone , a 56 y.o. male  was evaluated in triage.  Pt complains of syncope.  States that earlier today he was walking and suddenly felt diaphoretic and lightheaded, sat down on a bench and subsequently had a syncopal episode.  Unsure how long this lasted as he was alone, denies any injuries, does not think he hit his head.  States when he awoke he felt back to normal, however took the bus here for further evaluation.  States that he has diabetes and hypertension and has not been taking medications for 'a long time.'  Review of Systems  Positive: Syncope Negative: Fever, chills, headache, shortness of breath, nausea, vomiting, diarrhea  Physical Exam  BP (!) 193/106 (BP Location: Right Arm)    Pulse (!) 105    Temp 98.4 F (36.9 C) (Oral)    Resp (!) 25    SpO2 98%  Gen:   Awake, no distress   Resp:  Normal effort  MSK:   Moves extremities without difficulty  Other:  Alert and oriented and overall neurologically intact  Medical Decision Making  Medically screening exam initiated at 9:33 PM.  Appropriate orders placed.  Seraphin Childrey Ainley was informed that the remainder of the evaluation will be completed by another provider, this initial triage assessment does not replace that evaluation, and the importance of remaining in the ED until their evaluation is complete.     Nestor Lewandowsky 09/05/21 2136

## 2021-09-06 ENCOUNTER — Observation Stay (HOSPITAL_COMMUNITY): Payer: Self-pay

## 2021-09-06 ENCOUNTER — Encounter (HOSPITAL_COMMUNITY): Payer: Self-pay | Admitting: Internal Medicine

## 2021-09-06 ENCOUNTER — Observation Stay (HOSPITAL_BASED_OUTPATIENT_CLINIC_OR_DEPARTMENT_OTHER): Payer: Self-pay

## 2021-09-06 ENCOUNTER — Emergency Department (HOSPITAL_COMMUNITY): Payer: Self-pay

## 2021-09-06 ENCOUNTER — Other Ambulatory Visit (HOSPITAL_COMMUNITY): Payer: Self-pay

## 2021-09-06 ENCOUNTER — Other Ambulatory Visit: Payer: Self-pay | Admitting: Critical Care Medicine

## 2021-09-06 DIAGNOSIS — R55 Syncope and collapse: Secondary | ICD-10-CM

## 2021-09-06 DIAGNOSIS — E119 Type 2 diabetes mellitus without complications: Secondary | ICD-10-CM

## 2021-09-06 LAB — RAPID URINE DRUG SCREEN, HOSP PERFORMED
Amphetamines: NOT DETECTED
Barbiturates: NOT DETECTED
Benzodiazepines: NOT DETECTED
Cocaine: POSITIVE — AB
Opiates: NOT DETECTED
Tetrahydrocannabinol: NOT DETECTED

## 2021-09-06 LAB — CBG MONITORING, ED
Glucose-Capillary: 213 mg/dL — ABNORMAL HIGH (ref 70–99)
Glucose-Capillary: 254 mg/dL — ABNORMAL HIGH (ref 70–99)
Glucose-Capillary: 173 mg/dL — ABNORMAL HIGH (ref 70–99)

## 2021-09-06 LAB — BASIC METABOLIC PANEL
Anion gap: 9 (ref 5–15)
BUN: 16 mg/dL (ref 6–20)
CO2: 24 mmol/L (ref 22–32)
Calcium: 8.8 mg/dL — ABNORMAL LOW (ref 8.9–10.3)
Chloride: 102 mmol/L (ref 98–111)
Creatinine, Ser: 1.16 mg/dL (ref 0.61–1.24)
GFR, Estimated: >60 mL/min (ref >60)
Glucose, Bld: 231 mg/dL — ABNORMAL HIGH (ref 70–99)
Potassium: 3.6 mmol/L (ref 3.5–5.1)
Sodium: 135 mmol/L (ref 135–145)

## 2021-09-06 LAB — ECHOCARDIOGRAM COMPLETE
AR max vel: 1.65 cm2
AV Area VTI: 1.93 cm2
AV Area mean vel: 1.79 cm2
AV Mean grad: 5 mmHg
AV Peak grad: 9.9 mmHg
Ao pk vel: 1.57 m/s
Area-P 1/2: 3.87 cm2
Calc EF: 44.6 %
S' Lateral: 3.7 cm
Single Plane A2C EF: 30.2 %
Single Plane A4C EF: 54.9 %

## 2021-09-06 LAB — RESP PANEL BY RT-PCR (FLU A&B, COVID) ARPGX2
Influenza A by PCR: NEGATIVE
Influenza B by PCR: NEGATIVE
SARS Coronavirus 2 by RT PCR: NEGATIVE

## 2021-09-06 LAB — CBC
HCT: 43.4 % (ref 39.0–52.0)
Hemoglobin: 14.5 g/dL (ref 13.0–17.0)
MCH: 28 pg (ref 26.0–34.0)
MCHC: 33.4 g/dL (ref 30.0–36.0)
MCV: 83.8 fL (ref 80.0–100.0)
Platelets: 155 10*3/uL (ref 150–400)
RBC: 5.18 MIL/uL (ref 4.22–5.81)
RDW: 12.8 % (ref 11.5–15.5)
WBC: 4.8 10*3/uL (ref 4.0–10.5)
nRBC: 0 % (ref 0.0–0.2)

## 2021-09-06 LAB — HEMOGLOBIN A1C
Hgb A1c MFr Bld: 11 % — ABNORMAL HIGH (ref 4.8–5.6)
Mean Plasma Glucose: 269 mg/dL

## 2021-09-06 LAB — TROPONIN I (HIGH SENSITIVITY)
Troponin I (High Sensitivity): 79 ng/L — ABNORMAL HIGH (ref <18)
Troponin I (High Sensitivity): 62 ng/L — ABNORMAL HIGH (ref <18)
Troponin I (High Sensitivity): 53 ng/L — ABNORMAL HIGH (ref <18)

## 2021-09-06 LAB — HIV ANTIBODY (ROUTINE TESTING W REFLEX): HIV Screen 4th Generation wRfx: NONREACTIVE

## 2021-09-06 LAB — D-DIMER, QUANTITATIVE: D-Dimer, Quant: <0.27 ug/mL-FEU (ref 0.00–0.50)

## 2021-09-06 MED ORDER — VALSARTAN-HYDROCHLOROTHIAZIDE 160-12.5 MG PO TABS
2.0000 | ORAL_TABLET | Freq: Every day | ORAL | 2 refills | Status: AC
Start: 2021-09-06 — End: ?
  Filled 2021-09-06: qty 60, 60d supply, fill #0

## 2021-09-06 MED ORDER — METFORMIN HCL 500 MG PO TABS
1000.0000 mg | ORAL_TABLET | Freq: Two times a day (BID) | ORAL | 2 refills | Status: AC
Start: 2021-09-06 — End: ?
  Filled 2021-09-06: qty 120, 30d supply, fill #0

## 2021-09-06 MED ORDER — ENOXAPARIN SODIUM 40 MG/0.4ML IJ SOSY: 40.0000 mg | PREFILLED_SYRINGE | INTRAMUSCULAR | Status: DC

## 2021-09-06 MED ORDER — ALBUTEROL SULFATE (2.5 MG/3ML) 0.083% IN NEBU: 2.5000 mg | INHALATION_SOLUTION | RESPIRATORY_TRACT | Status: DC | PRN

## 2021-09-06 MED ORDER — TRUEPLUS LANCETS 28G MISC
1 refills | Status: AC
  Filled 2021-09-06: qty 100, fill #0

## 2021-09-06 MED ORDER — AMLODIPINE BESYLATE 10 MG PO TABS
10.0000 mg | ORAL_TABLET | Freq: Every day | ORAL | 2 refills | Status: DC
  Filled 2021-09-06: qty 30, 30d supply, fill #0

## 2021-09-06 MED ORDER — ASPIRIN 325 MG PO TBEC
325.0000 mg | DELAYED_RELEASE_TABLET | Freq: Every day | ORAL | 2 refills | Status: AC
  Filled 2021-09-06: qty 30, 30d supply, fill #0

## 2021-09-06 MED ORDER — ASPIRIN 81 MG PO CHEW
324.0000 mg | CHEWABLE_TABLET | Freq: Once | ORAL | Status: AC
  Administered 2021-09-06: 324 mg via ORAL
  Filled 2021-09-06: qty 4

## 2021-09-06 MED ORDER — TRUE METRIX METER W/DEVICE KIT
PACK | 0 refills | Status: DC
  Filled 2021-09-06: qty 1, 30d supply, fill #0

## 2021-09-06 MED ORDER — HYDROCHLOROTHIAZIDE 12.5 MG PO TABS
12.5000 mg | ORAL_TABLET | Freq: Every day | ORAL | Status: DC
  Administered 2021-09-06: 12.5 mg via ORAL
  Filled 2021-09-06: qty 1

## 2021-09-06 MED ORDER — ALBUTEROL SULFATE (2.5 MG/3ML) 0.083% IN NEBU
2.5000 mg | INHALATION_SOLUTION | Freq: Four times a day (QID) | RESPIRATORY_TRACT | Status: DC
  Administered 2021-09-06 (×2): 2.5 mg via RESPIRATORY_TRACT
  Filled 2021-09-06 (×2): qty 3

## 2021-09-06 MED ORDER — TRUE METRIX BLOOD GLUCOSE TEST VI STRP
ORAL_STRIP | 12 refills | Status: AC
  Filled 2021-09-06: qty 100, fill #0

## 2021-09-06 MED ORDER — INSULIN ASPART 100 UNIT/ML IJ SOLN
0.0000 [IU] | Freq: Three times a day (TID) | INTRAMUSCULAR | Status: DC
  Administered 2021-09-06: 2 [IU] via SUBCUTANEOUS
  Administered 2021-09-06: 5 [IU] via SUBCUTANEOUS

## 2021-09-06 MED ORDER — TRUE METRIX METER W/DEVICE KIT
PACK | 0 refills | Status: AC
  Filled 2021-09-06: qty 1, fill #0

## 2021-09-06 MED ORDER — TRUE METRIX BLOOD GLUCOSE TEST VI STRP
ORAL_STRIP | 12 refills | Status: DC
  Filled 2021-09-06: qty 100, 30d supply, fill #0

## 2021-09-06 MED ORDER — AMLODIPINE BESYLATE 10 MG PO TABS
10.0000 mg | ORAL_TABLET | Freq: Every day | ORAL | 2 refills | Status: AC
  Filled 2021-09-06: qty 30, 30d supply, fill #0

## 2021-09-06 MED ORDER — ACETAMINOPHEN 325 MG PO TABS: 650.0000 mg | ORAL_TABLET | Freq: Four times a day (QID) | ORAL | Status: DC | PRN

## 2021-09-06 MED ORDER — AMLODIPINE BESYLATE 5 MG PO TABS: 10.0000 mg | ORAL_TABLET | Freq: Every day | ORAL | Status: DC

## 2021-09-06 MED ORDER — HYDRALAZINE HCL 20 MG/ML IJ SOLN: 5.0000 mg | INTRAMUSCULAR | Status: DC | PRN

## 2021-09-06 MED ORDER — ACETAMINOPHEN 500 MG PO TABS
500.0000 mg | ORAL_TABLET | Freq: Four times a day (QID) | ORAL | Status: DC | PRN
Start: 2021-09-06 — End: 2021-09-06

## 2021-09-06 MED ORDER — ACETAMINOPHEN 650 MG RE SUPP: 650.0000 mg | Freq: Four times a day (QID) | RECTAL | Status: DC | PRN

## 2021-09-06 MED ORDER — VALSARTAN-HYDROCHLOROTHIAZIDE 160-12.5 MG PO TABS
1.0000 | ORAL_TABLET | Freq: Every day | ORAL | 2 refills | Status: DC
  Filled 2021-09-06: qty 30, 15d supply, fill #0

## 2021-09-06 MED ORDER — AMLODIPINE BESYLATE 5 MG PO TABS
5.0000 mg | ORAL_TABLET | Freq: Every day | ORAL | Status: DC
  Administered 2021-09-06: 5 mg via ORAL
  Filled 2021-09-06: qty 1

## 2021-09-06 MED ORDER — TRUEPLUS LANCETS 28G MISC
1 refills | Status: DC
  Filled 2021-09-06: qty 100, 30d supply, fill #0

## 2021-09-06 MED ORDER — METFORMIN HCL 500 MG PO TABS
1000.0000 mg | ORAL_TABLET | Freq: Two times a day (BID) | ORAL | 2 refills | Status: DC
  Filled 2021-09-06: qty 120, 30d supply, fill #0

## 2021-09-06 MED ORDER — ENALAPRIL MALEATE 5 MG PO TABS
5.0000 mg | ORAL_TABLET | Freq: Every day | ORAL | Status: DC
  Administered 2021-09-06: 5 mg via ORAL
  Filled 2021-09-06: qty 1

## 2021-09-06 MED ORDER — ASPIRIN 325 MG PO TBEC
325.0000 mg | DELAYED_RELEASE_TABLET | Freq: Every day | ORAL | 2 refills | Status: DC
  Filled 2021-09-06: qty 30, 30d supply, fill #0

## 2021-09-06 NOTE — ED Notes (Signed)
Patient transported to X-ray 

## 2021-09-06 NOTE — ED Notes (Signed)
DC instructions reviewed with pt. Pt verbalized understanding.  PT DC.  

## 2021-09-06 NOTE — ED Notes (Signed)
ECHO in progress- 

## 2021-09-06 NOTE — H&P (Addendum)
History and Physical    Donald Malone NAT:[id number] DOB: [birth year] DOA: 09/05/2021  PCP: Elsie Stain, MD  Patient coming from: Patient is homeless.  Chief Complaint: Loss of consciousness.  HPI: Donald Malone is a 56 y.o. male with history of stroke in July 2021, diabetes mellitus, hypertension, polysubstance abuse, COPD presents to the ER after patient had an episode of loss of consciousness.  Patient states he was walking when he suddenly felt dizzy and sat on a bench and lost consciousness and fell onto the floor.  He is not sure if he hit his head or not.  Denies any chest pain or shortness of breath prior to the syncope.  But did feel dizzy.  After waking up he came to the ER himself.  He does not know how long he lost consciousness but did not have any incontinence of urine or tongue bite.  Patient states he has been taking all of his medications for few months now.  Admits to taking cocaine 3 days ago.  ED Course: In the ER patient blood pressure initially was 193/110.  Chest x-ray EKG were unremarkable.  QTc interval was 475 ms.  High sensitive troponin was 72 and 79.  On exam patient also has mild wheezing.  Patient admitted for further observation.  Review of Systems: As per HPI, rest all negative.   Past Medical History:  Diagnosis Date   COPD (chronic obstructive pulmonary disease) ([place])    Diabetes mellitus without complication ([place])    Hypertension     History reviewed. No pertinent surgical history.   reports that he has quit smoking. He smoked an average of 1 pack per day. He has never used smokeless tobacco. No history on file for alcohol use and drug use.  No Known Allergies  History reviewed. No pertinent family history.  Prior to Admission medications   Medication Sig Start Date End Date Taking? Authorizing Provider  amLODipine (NORVASC) 10 MG tablet Take 1 tablet (10 mg total) by mouth daily. Patient not taking: Reported on 09/06/2021 03/20/21    Elsie Stain, MD  aspirin EC 325 MG tablet Take 1 tablet (325 mg total) by mouth daily. Patient not taking: Reported on 09/06/2021 03/20/21   Elsie Stain, MD  Blood Glucose Monitoring Suppl (TRUE METRIX METER) w/Device KIT Use to measure blood sugar twice a day Patient not taking: Reported on 09/06/2021 08/15/20   Elsie Stain, MD  clopidogrel (PLAVIX) 75 MG tablet Take 1 tablet (75 mg total) by mouth daily. Patient not taking: Reported on 09/06/2021 03/20/21   Elsie Stain, MD  glucose blood (TRUE METRIX BLOOD GLUCOSE TEST) test strip Use as instructed Patient not taking: Reported on 09/06/2021 01/23/21   Elsie Stain, MD  metFORMIN (GLUCOPHAGE) 500 MG tablet Take 2 tablets (1,000 mg total) by mouth 2 (two) times daily with a meal. Patient not taking: Reported on 09/06/2021 03/20/21   Elsie Stain, MD  TRUEplus Lancets 28G MISC Use to measure blood sugar twice a day Patient not taking: Reported on 09/06/2021 01/09/21   Elsie Stain, MD  valsartan-hydrochlorothiazide (DIOVAN-HCT) 320-25 MG tablet Take 1 tablet by mouth daily. Patient not taking: Reported on 09/06/2021 03/20/21   Elsie Stain, MD    Physical Exam: Constitutional: Moderately built and nourished. Vitals:   09/05/21 2054 09/06/21 0103 09/06/21 0315  BP: (!) 193/106 (!) 152/99 (!) 144/90  Pulse: (!) 105 83   Resp: (!) 25 (!) 22 (!) 21  Temp: 98.4 F (36.9 C) 98.5 F (36.9 C)   TempSrc: Oral Oral   SpO2: 98% 96%    Eyes: Anicteric no pallor. ENMT: No discharge from the ears eyes nose and mouth. Neck: No mass felt.  No neck rigidity. Respiratory: No rhonchi or crepitations. Cardiovascular: S1-S2 heard. Abdomen: Soft nontender bowel sounds present. Musculoskeletal: No edema. Skin: No rash. Neurologic: Alert awake oriented time place and person.  Moves all extremities. Psychiatric: Appears normal.  Normal affect.   Labs on Admission: I have personally reviewed following labs and imaging  studies  CBC: Recent Labs  Lab 09/05/21 2141  WBC 4.6  NEUTROABS 2.7  HGB 14.5  HCT 45.0  MCV 84.7  PLT 509   Basic Metabolic Panel: Recent Labs  Lab 09/05/21 2141  NA 136  K 3.8  CL 101  CO2 25  GLUCOSE 212*  BUN 14  CREATININE 1.16  CALCIUM 9.2   GFR: CrCl cannot be calculated (Unknown ideal weight.). Liver Function Tests: Recent Labs  Lab 09/05/21 2141  AST 42*  ALT 53*  ALKPHOS 87  BILITOT 0.4  PROT 7.4  ALBUMIN 3.6   No results for input(s): LIPASE, AMYLASE in the last 168 hours. No results for input(s): AMMONIA in the last 168 hours. Coagulation Profile: No results for input(s): INR, PROTIME in the last 168 hours. Cardiac Enzymes: No results for input(s): CKTOTAL, CKMB, CKMBINDEX, TROPONINI in the last 168 hours. BNP (last 3 results) No results for input(s): PROBNP in the last 8760 hours. HbA1C: No results for input(s): HGBA1C in the last 72 hours. CBG: No results for input(s): GLUCAP in the last 168 hours. Lipid Profile: No results for input(s): CHOL, HDL, LDLCALC, TRIG, CHOLHDL, LDLDIRECT in the last 72 hours. Thyroid Function Tests: No results for input(s): TSH, T4TOTAL, FREET4, T3FREE, THYROIDAB in the last 72 hours. Anemia Panel: No results for input(s): VITAMINB12, FOLATE, FERRITIN, TIBC, IRON, RETICCTPCT in the last 72 hours. Urine analysis:    Component Value Date/Time   COLORURINE YELLOW 09/05/2021 2212   APPEARANCEUR CLEAR 09/05/2021 2212   LABSPEC 1.022 09/05/2021 2212   PHURINE 6.0 09/05/2021 2212   GLUCOSEU >=500 (A) 09/05/2021 2212   HGBUR SMALL (A) 09/05/2021 2212   BILIRUBINUR NEGATIVE 09/05/2021 2212   [place] 09/05/2021 2212   PROTEINUR 100 (A) 09/05/2021 2212   NITRITE NEGATIVE 09/05/2021 2212   LEUKOCYTESUR NEGATIVE 09/05/2021 2212   Sepsis Labs: _0 (procalcitonin:4,lacticidven:4) )No results found for this or any previous visit (from the past 240 hour(s)).   Radiological Exams on Admission: DG  Chest Port 1 View  Result Date: 09/06/2021 CLINICAL DATA:  Syncope EXAM: PORTABLE CHEST 1 VIEW COMPARISON:  None. FINDINGS: The heart size and mediastinal contours are within normal limits. Both lungs are clear. The visualized skeletal structures are unremarkable. IMPRESSION: No active disease. Electronically Signed   By: Ulyses Jarred M.D.   On: 09/06/2021 03:41    EKG: Independently reviewed.  Normal sinus rhythm QTc of 475 ms.  Assessment/Plan Principal Problem:   Syncope Active Problems:   Neurologic deficit due to acute ischemic cerebrovascular accident (CVA) ([place])   COPD (chronic obstructive pulmonary disease) ([place])   Diabetes mellitus without complication ([place])   Hypertension    Syncope cause not clear.  Patient was feeling dizzy prior to the syncopal episode.  Will check orthostatics closely monitor in telemetry.  Last 2D echo done in July 2021 showed EF of 50 to 55% with grade 3 diastolic dysfunction.  CT head is pending. Mildly elevated  troponin denies any chest pain.  Will trend cardiac markers check 2D echo and D-dimer.  On aspirin. Hypertensive urgency likely from patient not taking his blood pressure medicines.  We will restart patient on amlodipine 5 mg and keep patient n.p.o. and IV hydralazine.  Follow blood pressure trends. Diabetes mellitus type 2 not on any medication at this time.  We will keep patient on sliding scale check hemoglobin A1c. Polysubstance abuse including tobacco and cocaine.  Advised about quitting. Mild elevated LFTs follow LFTs.  Check acute hepatitis panel with next blood draw.  Patient states he is not drinking alcohol recently. Prior history of CVA we will keep patient on aspirin. History of COPD with tobacco abuse.  Advised about quitting smoking on exam patient does have mild wheezing we will keep patient on nebulizer.   DVT prophylaxis: Lovenox. Code Status: Full code. Family Communication: Discussed with patient. Disposition Plan: To be  determined. Consults called: Education officer, museum. Admission status: Observation.   Rise Patience MD [facility] Pager [phone].  If 7PM-7AM, please contact night-coverage [email or web address] Password [facility]  09/06/2021, 4:18 AM

## 2021-09-06 NOTE — ED Provider Notes (Signed)
[facility] EMERGENCY DEPARTMENT Provider Note   CSN: [id number] Arrival date & time: 09/05/21  2046     History  Chief Complaint  Patient presents with   Loss of Consciousness    Donald Malone is a 56 y.o. male.  The history is provided by the patient.  Loss of Consciousness He has history of hypertension, diabetes, hyperlipidemia, COPD comes in after syncopal episode.  He states that he was walking when he started to get a headache and feeling lightheaded.  He tried to sit down on the bench, but passed out.  He does not know how long he was unconscious.  He denies chest pain, heaviness, tightness, pressure.  He denies dyspnea or nausea but did have some mild diaphoresis.  He has never had any syncopal episodes in the past.  He feels like he is back to his baseline.  He is a cigarette smoker.   Home Medications Prior to Admission medications   Medication Sig Start Date End Date Taking? Authorizing Provider  amLODipine (NORVASC) 10 MG tablet Take 1 tablet (10 mg total) by mouth daily. 03/20/21   Elsie Stain, MD  aspirin EC 325 MG tablet Take 1 tablet (325 mg total) by mouth daily. 03/20/21   Elsie Stain, MD  Blood Glucose Monitoring Suppl (TRUE METRIX METER) w/Device KIT Use to measure blood sugar twice a day 08/15/20   Elsie Stain, MD  clopidogrel (PLAVIX) 75 MG tablet Take 1 tablet (75 mg total) by mouth daily. 03/20/21   Elsie Stain, MD  glucose blood (TRUE METRIX BLOOD GLUCOSE TEST) test strip Use as instructed 01/23/21   Elsie Stain, MD  metFORMIN (GLUCOPHAGE) 500 MG tablet Take 2 tablets (1,000 mg total) by mouth 2 (two) times daily with a meal. 03/20/21   Elsie Stain, MD  TRUEplus Lancets 28G MISC Use to measure blood sugar twice a day 01/09/21   Elsie Stain, MD  valsartan-hydrochlorothiazide (DIOVAN-HCT) 320-25 MG tablet Take 1 tablet by mouth daily. 03/20/21   Elsie Stain, MD      Allergies    Patient has no  known allergies.    Review of Systems   Review of Systems  Cardiovascular:  Positive for syncope.  All other systems reviewed and are negative.  Physical Exam Updated Vital Signs BP (!) 152/99 (BP Location: Left Arm)    Pulse 83    Temp 98.5 F (36.9 C) (Oral)    Resp (!) 22    SpO2 96%  Physical Exam Vitals and nursing note reviewed.  56 year old male, resting comfortably and in no acute distress. Vital signs are significant for slightly elevated respiratory rate and mildly elevated blood pressure. Oxygen saturation is 96%, which is normal. Head is normocephalic and atraumatic. PERRLA, EOMI. Oropharynx is clear. Neck is nontender and supple without adenopathy or JVD.  There are no carotid bruits. Back is nontender and there is no CVA tenderness. Lungs are clear without rales, wheezes, or rhonchi. Chest is nontender. Heart has regular rate and rhythm without murmur. Abdomen is soft, flat, nontender without masses or hepatosplenomegaly and peristalsis is normoactive. Extremities have 1-2+ edema, full range of motion is present. Skin is warm and dry without rash. Neurologic: Mental status is normal, cranial nerves are intact, strength is 5/5 in all 4 extremities.  ED Results / Procedures / Treatments   Labs (all labs ordered are listed, but only abnormal results are displayed) Labs Reviewed  COMPREHENSIVE METABOLIC PANEL -  Abnormal; Notable for the following components:      Result Value   Glucose, Bld 212 (*)    AST 42 (*)    ALT 53 (*)    All other components within normal limits  URINALYSIS, ROUTINE W REFLEX MICROSCOPIC - Abnormal; Notable for the following components:   Glucose, UA >=500 (*)    Hgb urine dipstick SMALL (*)    Protein, ur 100 (*)    Bacteria, UA RARE (*)    All other components within normal limits  TROPONIN I (HIGH SENSITIVITY) - Abnormal; Notable for the following components:   Troponin I (High Sensitivity) 72 (*)    All other components within normal  limits  TROPONIN I (HIGH SENSITIVITY) - Abnormal; Notable for the following components:   Troponin I (High Sensitivity) 79 (*)    All other components within normal limits  CBC WITH DIFFERENTIAL/PLATELET  CBG MONITORING, ED    EKG EKG Interpretation  Date/Time:  Thursday September 05 2021 21:42:48 EST Ventricular Rate:  96 PR Interval:  160 QRS Duration: 82 QT Interval:  376 QTC Calculation: 475 R Axis:   8 Text Interpretation: Normal sinus rhythm Normal ECG When compared with ECG of 22-Jun-2021 00:09, QT has shortened Confirmed by Delora Fuel ([id number]) on 09/06/2021 3:04:24 AM  Radiology No results found.  Procedures Procedures  Cardiac monitor, per my interpretation, shows normal sinus rhythm without ectopy.  Medications Ordered in ED Medications - No data to display  ED Course/ Medical Decision Making/ A&P                           Medical Decision Making Amount and/or Complexity of Data Reviewed Labs: ordered. Radiology: ordered.  Risk OTC drugs. Decision regarding hospitalization.   Syncope of uncertain cause.  The fact that it was unprovoked is concerning for cardiac cause.  Other than diaphoresis, no findings suggestive of vasovagal episode.  Consider arrhythmia.  ECG shows no acute changes and no evidence of ectopy.  CBC and metabolic panel are significant for mild elevation of transaminases which is actually improved compared with 06/22/2021.  However, troponin has come back mildly elevated at 72 with repeat troponin trending upward at 79.  This is concerning for possible non-STEMI.  He is given aspirin, will need to be admitted for further work-up.  Old records were reviewed, and he does have a prior hospitalization for stroke, ED visit for cocaine use.  We will need to check drug screen.  Case is discussed with Dr. Hal Hope of [facility], who agrees to admit the patient.        Final Clinical Impression(s) / ED Diagnoses Final diagnoses:  Syncope  and collapse  Elevated troponin  Elevated blood pressure reading with diagnosis of hypertension  Elevated transaminase level    Rx / DC Orders ED Discharge Orders     None         Delora Fuel, MD 50/03/70 [phone]

## 2021-09-06 NOTE — Discharge Summary (Addendum)
DISCHARGE SUMMARY  Donald Malone  MR#: [id number]  DOB:1965-12-05  Date of Admission: 09/05/2021 Date of Discharge: 09/06/2021  Attending Physician:Janeth Terry Hennie Duos, MD  Patient's XNT:[id number], Burnett Harry, MD  Consults: none   Disposition: D/C home   Follow-up Appts:  Follow-up Information     Argentina Donovan, PA-C Follow up on 10/02/2021.   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  He reports feeling dizzy when walking, sitting on a bench, and then waking up on the floor.  He was unsure if he hit his head.  He did not know how long he was unconscious.  In the ER he was found to have a blood pressure of 193/110.  He admitted to not taking his medications because he is homeless and reported he has no way of getting them. EKG was unremarkable.  Patient was found to be wheezing on exam.  Hospital Course:  Syncope - idiopathic CT head revealed no acute findings and chronic microvascular ischemic change -TTE w/o acute valvular changes or evidence of clot  -D-dimer normal - pt admitted to exhaustion due to working and not having a good place to sleep at  night - no chest pain or sob   Mildly newly diagnosed combined systolic and diastolic CHF EF 59-93% on TTE this admit w/ grade 3 DD, compared to 50-55% July 2021 - this is likely due to uncontrolled HTN - no new focal wall motion abnormalities compared to prior TTE (but basal and mid-inferior hypokinesis noted July 2021) - counseled pt on need for strict compliance w/ meds - asked TOC to assist w/ med acquisition, and meds to be dispensed from [place] to bedside before d/c - no signif volume overload on exam - already on ARB and diuretic - strive for better BP control - pt was anxious to be d/c and not willing to prolong his hospital stay - outpatient risk stratification for CAD would be advised given focal WMA on TTE dating back to 2021   Mildly elevated troponin No chest pain -trending downward already - likely related to uncontrolled HTN - see discussion above    Hypertensive urgency Patient has not been taking his blood pressure medications and admits to use of cocaine - avoid beta-blocker - resumed amlodipine -blood pressure improved but not yet at goal - resumed other usual home meds at time of d/c, w/ meds to be dispensed from [place] prior to leaving    DM2 -uncontrolled with hyperglycemia Is not on medical therapy - A1c 11 - discussed need to monitor CBGs and use meds as prescribed - metformin re-prescribed at time of d/c    Polysubstance abuse -tobacco and cocaine Counseled on absolute need to stop both substances, and connection to  very bad health outcomes given his risk factors  Mild transaminitis F/u as outpt    History of CVA Continue ASA - outside window for DAPT therefore will take plavix off his MAR (was not taking it anyway)   COPD with ongoing tobacco abuse No wheezing at time of d/c - counseled on need to stop smoking   Allergies as of 09/06/2021   No Known Allergies      Medication List     STOP taking these medications    clopidogrel 75 MG  tablet Commonly known as: PLAVIX   valsartan-hydrochlorothiazide 320-25 MG tablet Commonly known as: DIOVAN-HCT Replaced by: valsartan-hydrochlorothiazide 160-12.5 MG tablet       TAKE these medications    amLODipine 10 MG tablet Commonly known as: NORVASC Take 1 tablet (10 mg total) by mouth daily.   aspirin 325 MG EC tablet Take 1 tablet (325 mg total) by mouth daily.   metFORMIN 500 MG tablet Commonly known as: GLUCOPHAGE Take 2 tablets (1,000 mg total) by mouth 2 (two) times daily with a meal.   True Metrix Blood Glucose Test test strip Generic drug: glucose blood Use as instructed   True Metrix Meter w/Device Kit Use to measure blood sugar twice a day   TRUEplus Lancets 28G Misc Use to measure blood sugar twice a day   valsartan-hydrochlorothiazide 160-12.5 MG tablet Commonly known as: DIOVAN-HCT Take 2 tablets by mouth daily. Replaces: valsartan-hydrochlorothiazide 320-25 MG tablet        Day of Discharge BP (!) 170/98    Pulse 90    Temp 98.2 F (36.8 C) (Oral)    Resp 16    SpO2 95%   Physical Exam: General: No acute respiratory distress Lungs: Clear to auscultation bilaterally without wheezes or crackles Cardiovascular: Regular rate and rhythm without murmur gallop or rub normal S1 and S2 Abdomen: Nontender, nondistended, soft, bowel sounds positive, no rebound, no ascites, no appreciable mass Extremities: No significant cyanosis, clubbing, or edema bilateral lower extremities  Basic Metabolic Panel: Recent Labs  Lab 09/05/21 2141 09/06/21 0524  NA 136 135  K 3.8 3.6  CL 101 102  CO2 25 24  GLUCOSE 212* 231*  BUN 14 16  CREATININE 1.16 1.16  CALCIUM 9.2 8.8*    Liver Function Tests: Recent Labs  Lab 09/05/21 2141  AST 42*  ALT 53*  ALKPHOS 87  BILITOT 0.4  PROT 7.4  ALBUMIN 3.6    CBC: Recent Labs  Lab 09/05/21 2141 09/06/21 0524  WBC 4.6 4.8  NEUTROABS 2.7  --   HGB 14.5 14.5  HCT 45.0 43.4  MCV 84.7 83.8  PLT 189 155     CBG: Recent Labs  Lab 09/06/21 0855 09/06/21 1222  GLUCAP 254* 173*    Time spent in discharge (includes decision making & examination of pt): 30 minutes  09/06/2021, 4:11 PM   Cherene Altes, MD [facility] Office  [phone]

## 2021-10-02 ENCOUNTER — Inpatient Hospital Stay: Payer: Self-pay | Admitting: Physician Assistant

## 2021-10-02 NOTE — Progress Notes (Deleted)
Patient ID: Donald Malone, male   DOB: 14-Feb-1966, 56 y.o.   MRN: [id number] ? ?After ED visit 09/05/2021 for syncope and overnight hospitalization discharged on 09/06/2021: ?Loss of Consciousness ?He has history of hypertension, diabetes, hyperlipidemia, COPD comes in after syncopal episode.  He states that he was walking when he started to get a headache and feeling lightheaded.  He tried to sit down on the bench, but passed out.  He does not know how long he was unconscious.  He denies chest pain, heaviness, tightness, pressure.  He denies dyspnea or nausea but did have some mild diaphoresis.  He has never had any syncopal episodes in the past.  He feels like he is back to his baseline.  He is a cigarette smoker. ? ? ?Syncope of uncertain cause.  The fact that it was unprovoked is concerning for cardiac cause.  Other than diaphoresis, no findings suggestive of vasovagal episode.  Consider arrhythmia.  ECG shows no acute changes and no evidence of ectopy.  CBC and metabolic panel are significant for mild elevation of transaminases which is actually improved compared with 06/22/2021.  However, troponin has come back mildly elevated at 72 with repeat troponin trending upward at 79.  This is concerning for possible non-STEMI.  He is given aspirin, will need to be admitted for further work-up.  Old records were reviewed, and he does have a prior hospitalization for stroke, ED visit for cocaine use.  We will need to check drug screen.  Case is discussed with Dr. Toniann Fail of [facility], who agrees to admit the patient. ? ?Tests Needing Follow-up: ?-assess BP control ?-assess CBG control ?-consider outpt referral for CAD risk stratification  ?-assess control of newly appreciated CHF  ?-assess medication compliance  ?  ?Discharge Diagnoses: ?Syncope - idiopathic ?Newly diagnosed combined systolic and diastolic CHF w/o acute exacerbation  ?Mildly elevated troponin ?Hypertensive urgency ?DM2 -uncontrolled with  hyperglycemia ?Polysubstance abuse -tobacco and cocaine ?Mild transaminitis ?History of CVA ?COPD with ongoing tobacco abuse ?  ?Initial presentation: ?56yo homeless gentleman w/ a hx of CVA July 2021, DM2, HTN, COPD, and polysubstance abuse who presented to the ED after losing consciousness.  He reports feeling dizzy when walking, sitting on a bench, and then waking up on the floor.  He was unsure if he hit his head.  He did not know how long he was unconscious.  In the ER he was found to have a blood pressure of 193/110.  He admitted to not taking his medications because he is homeless and reported he has no way of getting them. EKG was unremarkable.  Patient was found to be wheezing on exam. ?  ?Hospital Course: ?  ?Syncope - idiopathic ?CT head revealed no acute findings and chronic microvascular ischemic change -TTE w/o acute valvular changes or evidence of clot  -D-dimer normal - pt admitted to exhaustion due to working and not having a good place to sleep at night - no chest pain or sob  ?  ?Mildly newly diagnosed combined systolic and diastolic CHF ?EF 45-50% on TTE this admit w/ grade 3 DD, compared to 50-55% July 2021 - this is likely due to uncontrolled HTN - no new focal wall motion abnormalities compared to prior TTE (but basal and mid-inferior hypokinesis noted July 2021) - counseled pt on need for strict compliance w/ meds - asked TOC to assist w/ med acquisition, and meds to be dispensed from [facility] to bedside before d/c - no signif volume overload  on exam - already on ARB and diuretic - strive for better BP control - pt was anxious to be d/c and not willing to prolong his hospital stay - outpatient risk stratification for CAD would be advised given focal WMA on TTE dating back to 2021 ?  ?Mildly elevated troponin ?No chest pain -trending downward already - likely related to uncontrolled HTN - see discussion above  ?  ?Hypertensive urgency ?Patient has not been taking his blood pressure  medications and admits to use of cocaine - avoid beta-blocker - resumed amlodipine -blood pressure improved but not yet at goal - resumed other usual home meds at time of d/c, w/ meds to be dispensed from [facility] prior to leaving  ?  ?DM2 -uncontrolled with hyperglycemia ?Is not on medical therapy - A1c 11 - discussed need to monitor CBGs and use meds as prescribed - metformin re-prescribed at time of d/c  ?  ?Polysubstance abuse -tobacco and cocaine ?Counseled on absolute need to stop both substances, and connection to very bad health outcomes given his risk factors ?  ?Mild transaminitis ?F/u as outpt  ?  ?History of CVA ?Continue ASA - outside window for DAPT therefore will take plavix off his MAR (was not taking it anyway) ?  ?COPD with ongoing tobacco abuse ?No wheezing at time of d/c - counseled on need to stop smoking  ?  ?

## 2023-05-21 IMAGING — CR DG KNEE 1-2V*L*
2 series · 2 of 2 positions shown · non-contrast
Comparison: None.

CLINICAL DATA: Left anterior knee pain after fall.

EXAM:
LEFT KNEE - 1-2 VIEW

[knee ap]
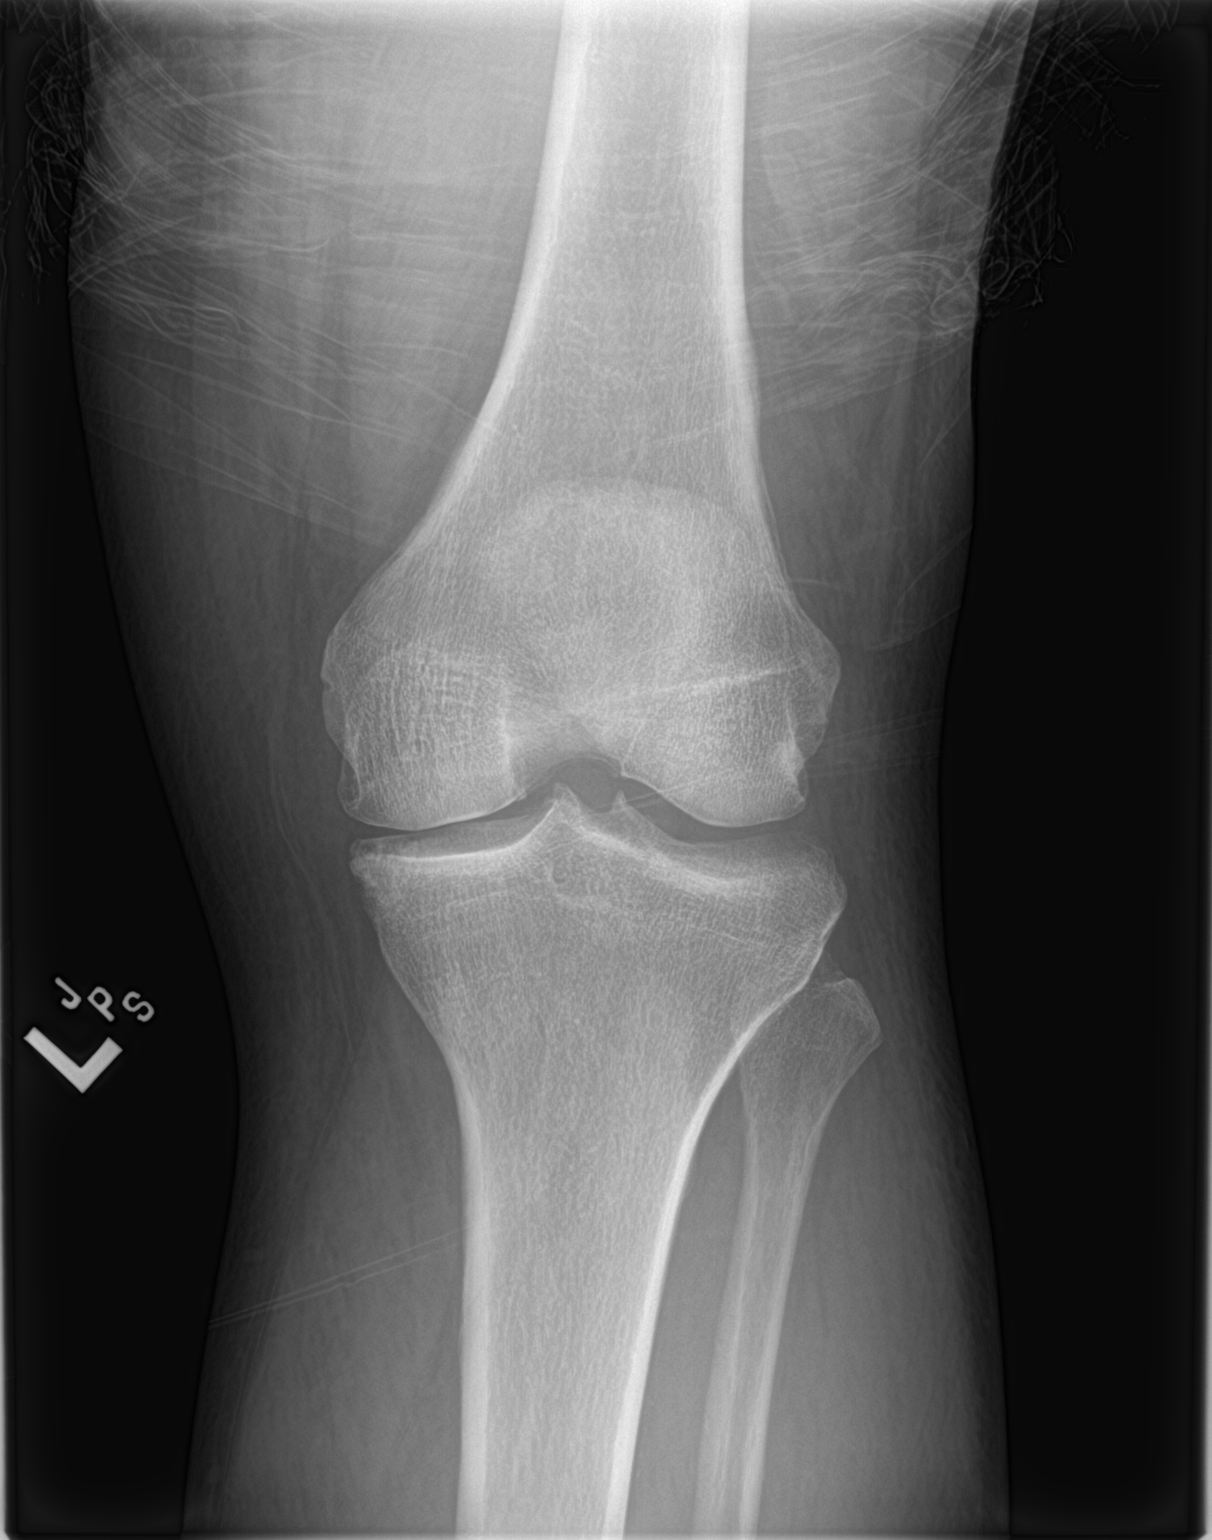

[knee lat]
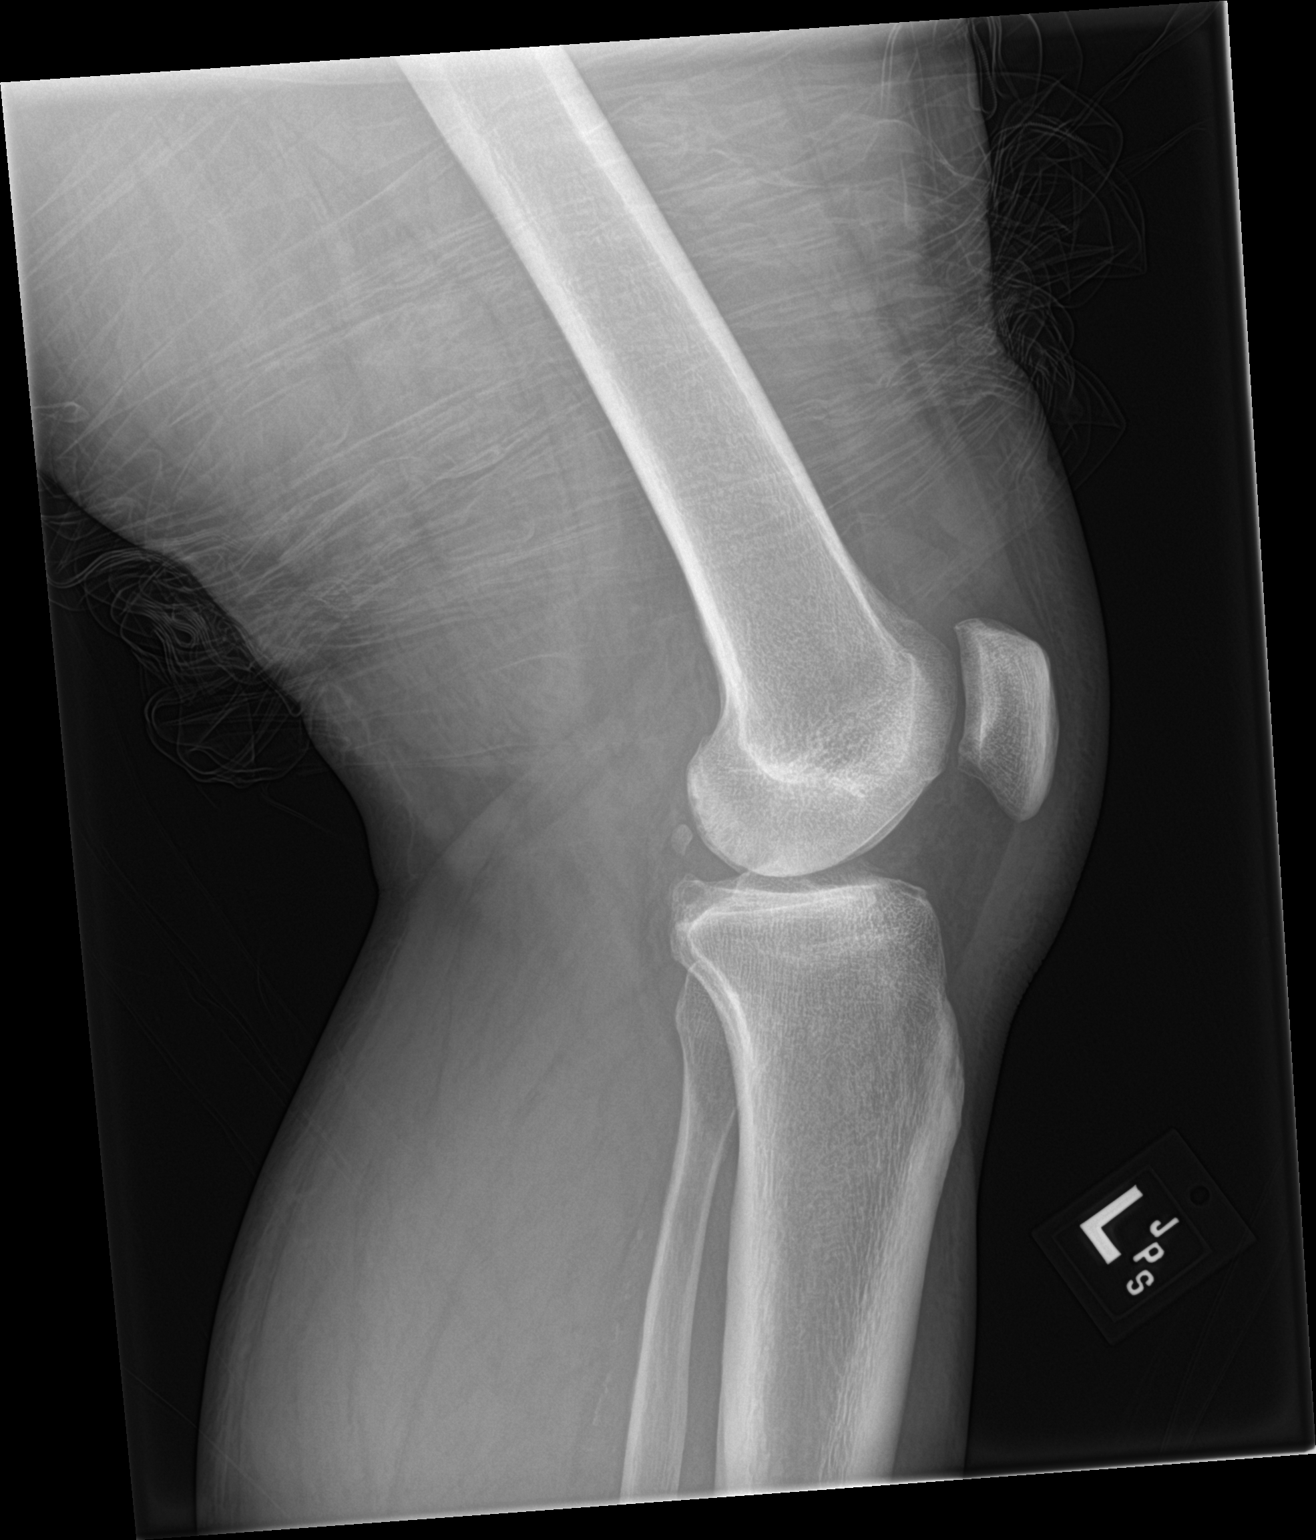

[2 of 2 positions shown; findings below may reference images not displayed]

FINDINGS: No acute fracture or dislocation. Small joint effusion. Mild medial
compartment joint space narrowing with small marginal osteophytes.
Bone mineralization is normal. Soft tissues are unremarkable.
IMPRESSION: 1. No acute osseous abnormality.  Small joint effusion.
2. Mild medial compartment osteoarthritis.

## 2023-05-21 IMAGING — CT CT HEAD W/O CM
3 of 4 series · 15 of 47 positions shown, 18 images · non-contrast
Comparison: 02/01/2020.

CLINICAL DATA: Syncope, cerebrovascular cause suspected.



[Series 3: head 5.0 h30s · axial · 0.46mm/px · z∈[-127,+18]mm · 9 of 35 slices shown, 12 images]
[im 3/35  brain]
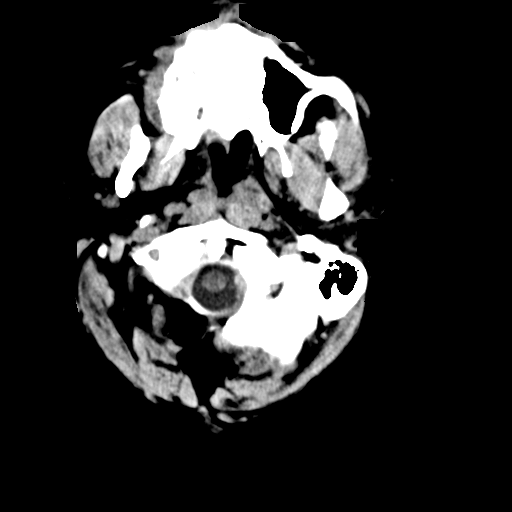
[im 3/35  bone]
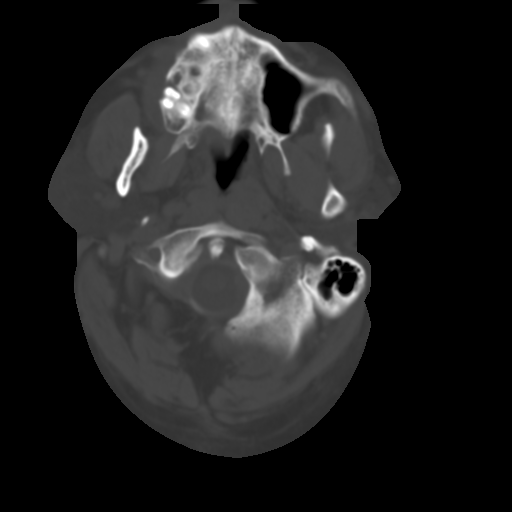
[im 8/35  brain]
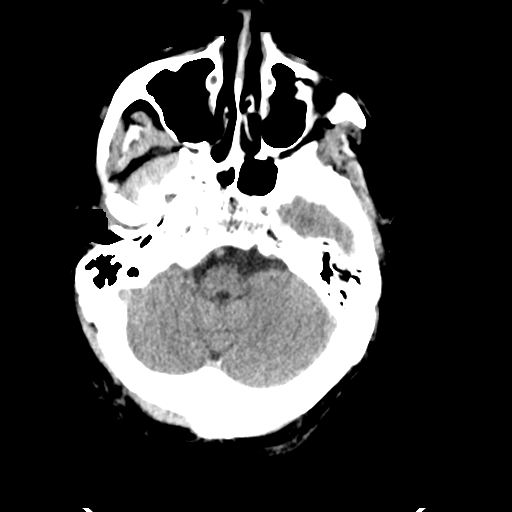
[im 10/35  brain]
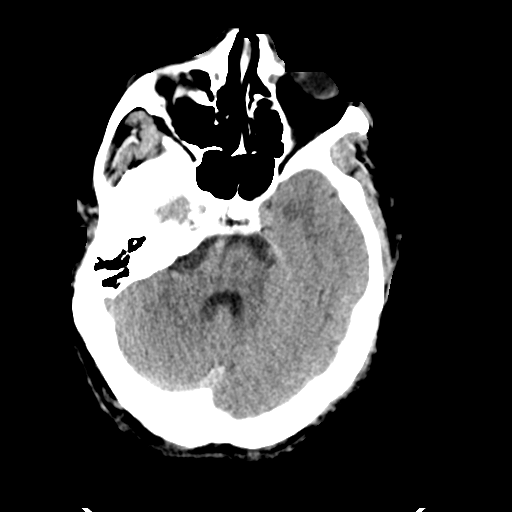
[im 15/35  brain]
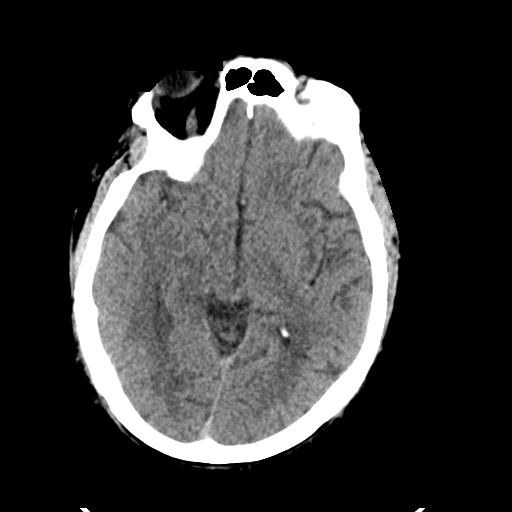
[im 18/35  brain]
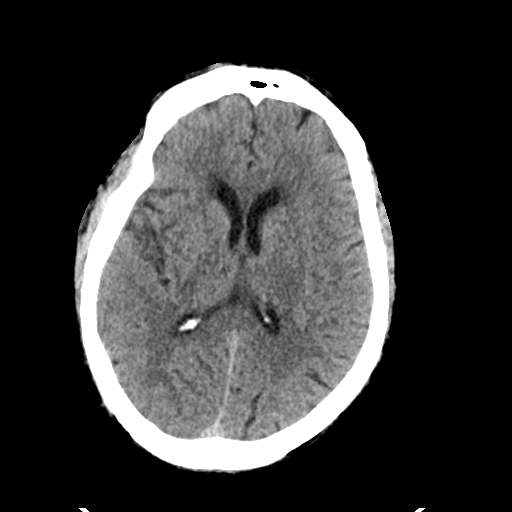
[im 18/35  bone]
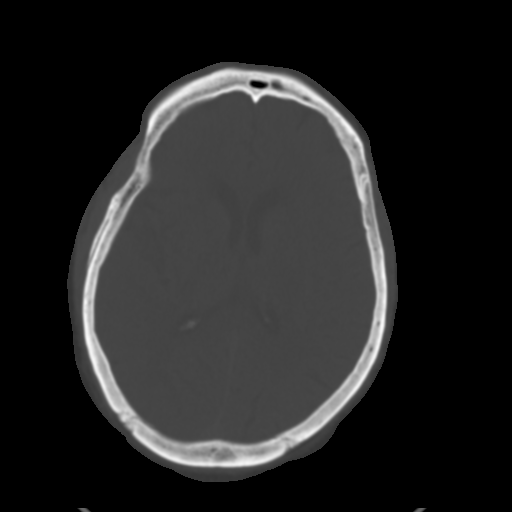
[im 20/35  brain]
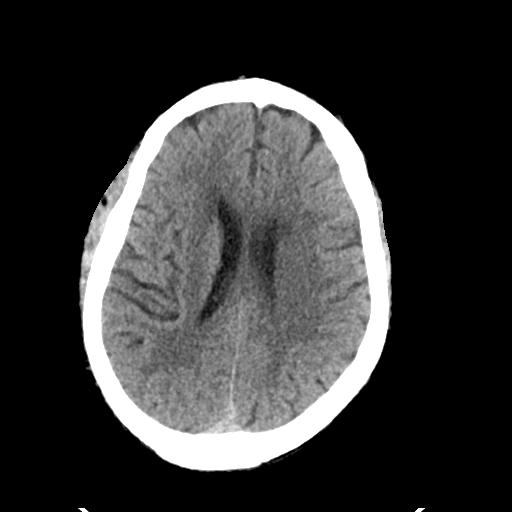
[im 25/35  brain]
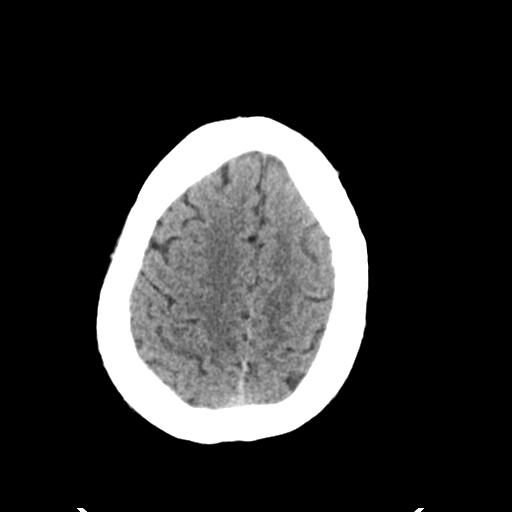
[im 27/35  brain]
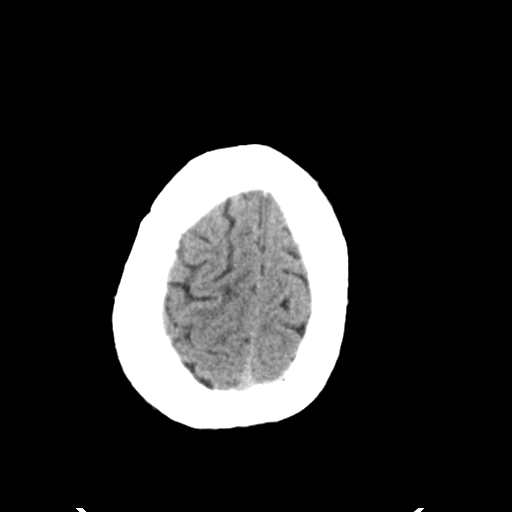
[im 32/35  brain]
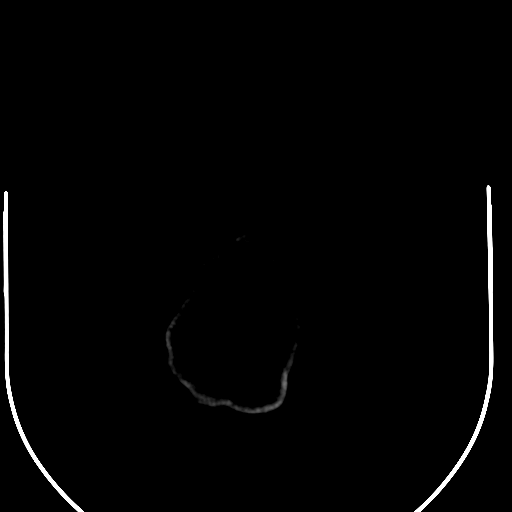
[im 32/35  bone]
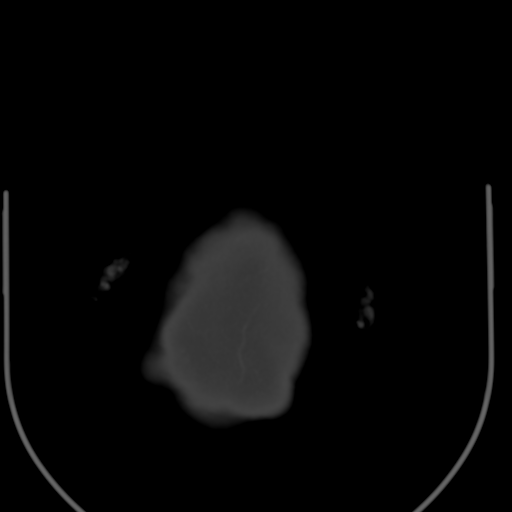

[Series 5: head 3.0 mpr cor · coronal · 0.33mm/px · 3 of 71 slices shown]
[im 24/71  brain]
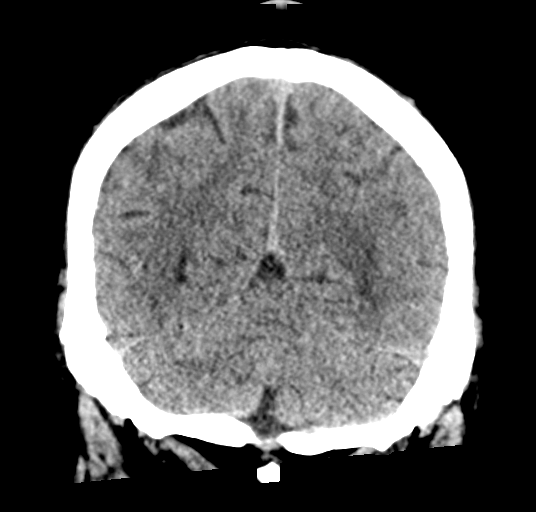
[im 32/71  brain]
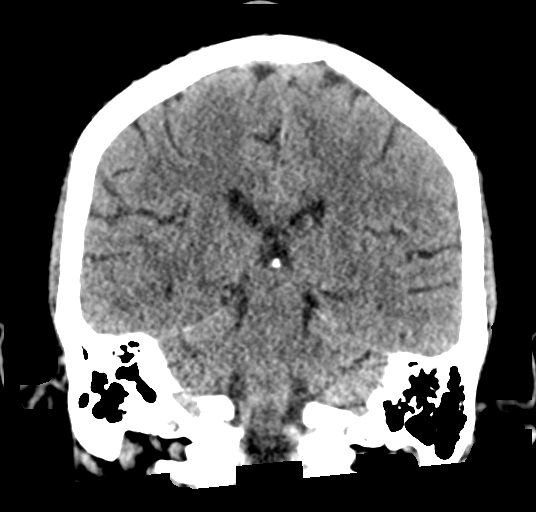
[im 39/71  brain]
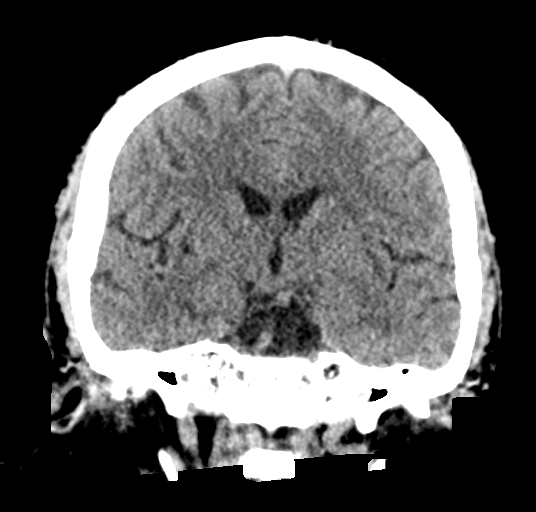

[Series 6: head 3.0 mpr sag · sagittal · 0.33mm/px · 3 of 61 slices shown]
[im 21/61  brain]
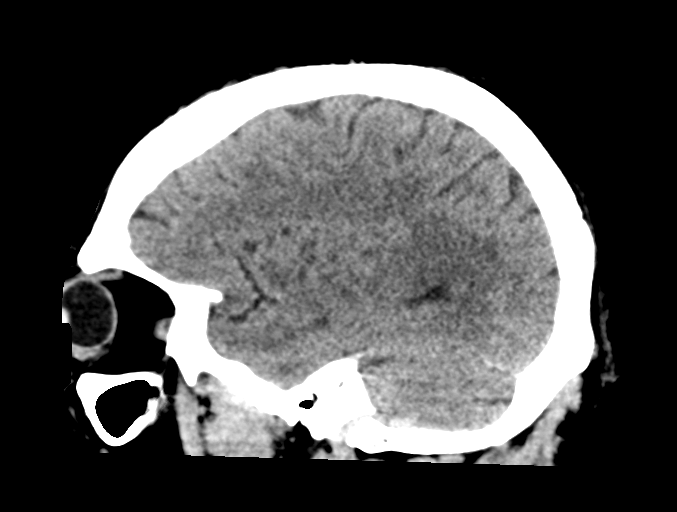
[im 31/61  brain]
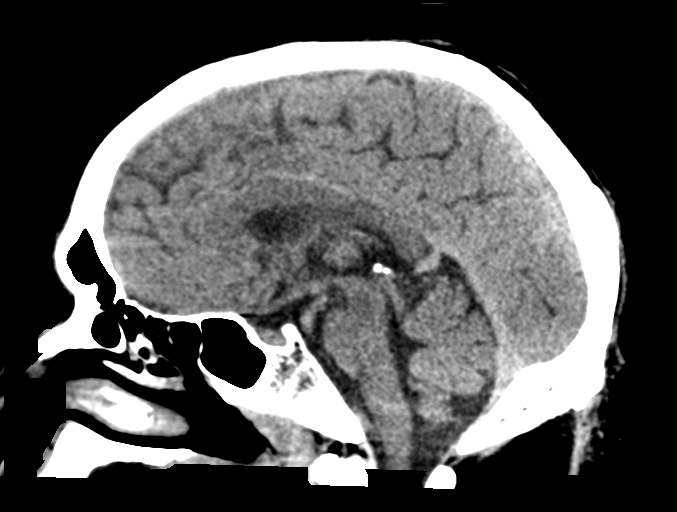
[im 40/61  brain]
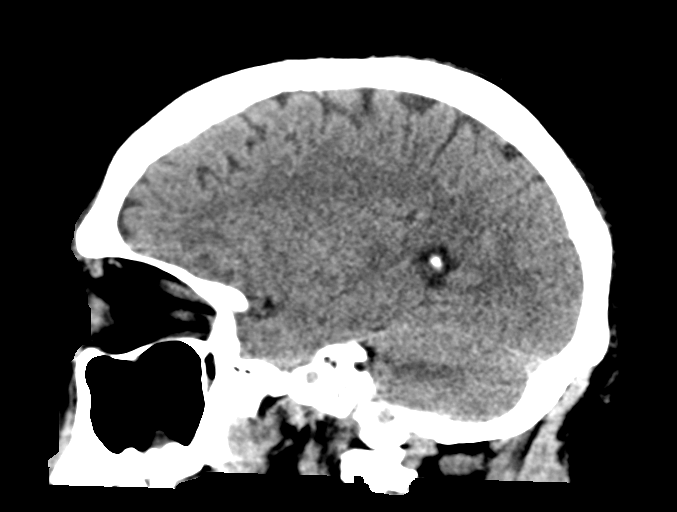

[15 of 47 positions shown; findings below may reference images not displayed]

FINDINGS: Brain: No acute intracranial hemorrhage, midline shift or mass
effect. No extra-axial fluid collection. Periventricular and
subcortical white matter hypodensities are present bilaterally. No
hydrocephalus.

Vascular: Atherosclerotic calcification of the carotid siphons. No
hyperdense vessel.

Skull: Normal. Negative for fracture or focal lesion.

Sinuses/Orbits: Mucosal thickening is present in the maxillary
sinuses and ethmoid air cells bilaterally. The orbits are within
normal limits.

Other: None.
IMPRESSION: 1. No acute intracranial process.
2. Chronic microvascular ischemic changes.
